# Patient Record
Sex: Male | Born: 1935 | Race: White | Hispanic: No | Marital: Married | Smoking: Former smoker
Health system: Southern US, Community
[De-identification: ages and names within clinical notes are randomized; demographics above are authoritative.]

## PROBLEM LIST (undated history)

## (undated) DIAGNOSIS — I10 Essential (primary) hypertension: Secondary | ICD-10-CM

## (undated) DIAGNOSIS — F32A Depression, unspecified: Secondary | ICD-10-CM

## (undated) DIAGNOSIS — G20 Parkinson's disease: Secondary | ICD-10-CM

## (undated) DIAGNOSIS — F5104 Psychophysiologic insomnia: Secondary | ICD-10-CM

## (undated) DIAGNOSIS — E785 Hyperlipidemia, unspecified: Secondary | ICD-10-CM

## (undated) DIAGNOSIS — E119 Type 2 diabetes mellitus without complications: Secondary | ICD-10-CM

## (undated) DIAGNOSIS — F329 Major depressive disorder, single episode, unspecified: Secondary | ICD-10-CM

## (undated) DIAGNOSIS — F419 Anxiety disorder, unspecified: Secondary | ICD-10-CM

## (undated) DIAGNOSIS — N189 Chronic kidney disease, unspecified: Secondary | ICD-10-CM

## (undated) HISTORY — DX: Anxiety disorder, unspecified: F41.9

## (undated) HISTORY — DX: Parkinson's disease: G20

## (undated) HISTORY — DX: Essential (primary) hypertension: I10

## (undated) HISTORY — DX: Major depressive disorder, single episode, unspecified: F32.9

## (undated) HISTORY — DX: Hyperlipidemia, unspecified: E78.5

## (undated) HISTORY — DX: Depression, unspecified: F32.A

## (undated) HISTORY — DX: Psychophysiologic insomnia: F51.04

## (undated) HISTORY — DX: Type 2 diabetes mellitus without complications: E11.9

---

## 2001-08-17 ENCOUNTER — Encounter: Admission: RE | Admit: 2001-08-17 | Discharge: 2001-11-15 | Payer: Self-pay | Admitting: Internal Medicine

## 2006-07-09 ENCOUNTER — Ambulatory Visit: Payer: Self-pay | Admitting: Internal Medicine

## 2009-09-29 HISTORY — PX: CYST EXCISION: SHX5701

## 2009-09-29 HISTORY — PX: CATARACT EXTRACTION: SUR2

## 2010-05-21 ENCOUNTER — Ambulatory Visit (HOSPITAL_COMMUNITY): Admission: RE | Admit: 2010-05-21 | Discharge: 2010-05-21 | Payer: Self-pay | Admitting: General Surgery

## 2010-12-13 LAB — CBC
Hemoglobin: 15.9 g/dL (ref 13.0–17.0)
MCH: 32.3 pg (ref 26.0–34.0)
MCHC: 35.4 g/dL (ref 30.0–36.0)
MCV: 91.3 fL (ref 78.0–100.0)
Platelets: 193 10*3/uL (ref 150–400)
RBC: 4.93 MIL/uL (ref 4.22–5.81)

## 2010-12-13 LAB — DIFFERENTIAL
Eosinophils Absolute: 0.4 10*3/uL (ref 0.0–0.7)
Eosinophils Relative: 5 % (ref 0–5)
Lymphs Abs: 1.3 10*3/uL (ref 0.7–4.0)
Monocytes Relative: 7 % (ref 3–12)

## 2010-12-13 LAB — BASIC METABOLIC PANEL
CO2: 29 mEq/L (ref 19–32)
Chloride: 100 mEq/L (ref 96–112)
Creatinine, Ser: 1.05 mg/dL (ref 0.4–1.5)
GFR calc Af Amer: >60 mL/min (ref >60)
Glucose, Bld: 331 mg/dL — ABNORMAL HIGH (ref 70–99)

## 2010-12-13 LAB — GLUCOSE, CAPILLARY

## 2011-02-14 NOTE — Assessment & Plan Note (Signed)
[facility]                           GASTROENTEROLOGY OFFICE NOTE   RONDELL, PARDON                     MRN:          [id number]  DATE:07/09/2006                            DOB:          02-11-1924    REFERRING PHYSICIAN:  Barry Dienes. Eloise Harman, M.D.   REASON FOR CONSULTATION:  Surveillance colonoscopy.   HISTORY:  This is a pleasant 77 year old white male with a history of  hypertension, hyperlipidemia, and diabetes mellitus.  He also has a history  of adenomatous colon polyps.  He initially underwent surveillance  colonoscopy in November 2002.  He was found to have small cecal adenoma as  well as a 15-mm sessile tubulovillous adenoma of the ascending colon.  This  was completely resected.  Other findings included diverticulosis and  hemorrhoids.  He was to follow up in one year.  He did receive a reminder  letter.  He was reminded by Dr. Eloise Harman recently to return for followup as  he had complained of constipation.  The constipation came on rather  abruptly.  He was treated with a stool softener.  His problem has resolved.  He is having no abdominal pain or bleeding.  His weight is stable.   PAST MEDICAL HISTORY:  As above.   PAST SURGICAL HISTORY:  None.   ALLERGIES:  NONE.   CURRENT MEDICATIONS:  1. Glucophage 1 gram daily.  2. Aspirin one half daily.  3. Diovan 160 mg daily.  4. Lipitor 10 mg every other day.  5. Multivitamin daily.   FAMILY HISTORY:  Negative for gastrointestinal malignancy.   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   REVIEW OF SYSTEMS:  Per diagnostic evaluation form.   PHYSICAL EXAMINATION:  GENERAL:  A well-appearing male in no acute distress.  VITAL SIGNS:  Blood pressure 110/60, heart rate is 80, weight is 200 pounds.  He is 6 feet in height.  HEENT:  Sclerae are anicteric.   Conjunctivae are pink.  Oral mucosa is  intact.  No adenopathy.  LUNGS:  Clear.  HEART:  Regular.  ABDOMEN:  Soft without tenderness, mass, or hernia.  Good bowel sounds  heard.   IMPRESSION:  This is an 77 year old gentleman with a history of adenomatous  colon polyps who presents today regarding surveillance colonoscopy.   I told the patient that the current standard for followup with the type of  pathology that he initially had would be 3 years.  It is now 5 years.  Aside  from transient constipation relieved with stools softeners, he is having no  symptoms.  We discussed in detail the risks/benefit profile of surveillance  colonoscopy in great detail.  At this point, he has elected to postpone any  decision regarding a followup exam, though will reserve the right to contact  this office in the future to schedule such and exam.  At this point, he is  an appointment candidate without contraindication.  At this point, he will  return to the  care of Dr. Eloise Harman.            ______________________________  Wilhemina Bonito. Eda Keys., MD      JNP/MedQ  DD:  07/09/2006  DT:  07/10/2006  Job #:  [id number]   cc:   Barry Dienes. Eloise Harman, M.D.

## 2012-07-28 ENCOUNTER — Other Ambulatory Visit: Payer: Self-pay | Admitting: Internal Medicine

## 2012-07-28 DIAGNOSIS — R55 Syncope and collapse: Secondary | ICD-10-CM

## 2012-08-02 ENCOUNTER — Other Ambulatory Visit: Payer: Self-pay

## 2012-08-03 ENCOUNTER — Ambulatory Visit
Admission: RE | Admit: 2012-08-03 | Discharge: 2012-08-03 | Disposition: A | Discharge status: Home / Self Care | Payer: Medicare Other | Source: Ambulatory Visit | Attending: Internal Medicine | Admitting: Internal Medicine

## 2012-08-03 DIAGNOSIS — R55 Syncope and collapse: Secondary | ICD-10-CM

## 2013-01-27 ENCOUNTER — Ambulatory Visit (INDEPENDENT_AMBULATORY_CARE_PROVIDER_SITE_OTHER): Payer: 59 | Admitting: Neurology

## 2013-01-27 ENCOUNTER — Encounter: Payer: Self-pay | Admitting: Neurology

## 2013-01-27 VITALS — BP 139/65 | HR 74 | Ht 70.0 in | Wt 190.0 lb

## 2013-01-27 DIAGNOSIS — G20 Parkinson's disease: Secondary | ICD-10-CM

## 2013-01-27 DIAGNOSIS — G20C Parkinsonism, unspecified: Secondary | ICD-10-CM

## 2013-01-27 DIAGNOSIS — E785 Hyperlipidemia, unspecified: Secondary | ICD-10-CM

## 2013-01-27 DIAGNOSIS — E119 Type 2 diabetes mellitus without complications: Secondary | ICD-10-CM

## 2013-01-27 DIAGNOSIS — I10 Essential (primary) hypertension: Secondary | ICD-10-CM

## 2013-01-27 MED ORDER — DICLOFENAC SODIUM 3 % TD GEL: 1.0000 | TRANSDERMAL | Status: DC | PRN

## 2013-01-27 NOTE — Progress Notes (Signed)
HPI: Mr. Burley is a 79 years old right-handed Caucasian male, accompanied by his wife, referred by his primary care physician Dr. Ivery Quale for evaluation of bilateral hand tremor, recent falling accident,   He had a past medical history of hypertension, hyperlipidemia, diabetes,  Since 2011, he was noticed to have bilateral hand tremor, mild changing in his posture, stooped forward, decreased arm swing, his mother and sister all suffered Alzheimer's disease, he denies significant gait difficulty, he denies REM sleep disorder, no loss sense of smell  Over past 3 weeks, he had two fall accident, the first one was he was preparing  coffee,  he fell foward, he has to hold onto things, the second one was a week later, he was bending down to pick up something on the floor, when he straight up again, he felt lightheaded, almost fell, he denied loss of consciousness, no heart palpitation, no chest pain, no warning signs  Over the past couple years, in the morning time, he felt woozy headed, strange, has to sit down drinking tea or coffee, he felt better afterwards.  He denies significant low back pain, no incontinence, mild bilateral feet paresthesia, CT head was normal.  Azilect was too expensive. Requip 0.25mg  bid was added by Dr. Jarold Motto, no significant improvement noticed. He has bilateral hands shaking, most notictable while shaving, right hand tremor, unsteadiness.  UPDATE May 1st 2014:  I have started him on Sinemet 25/100 3 times a day since January 2014, he denies significant side effect, noticed no significant improvement, he also complains of mild worsening memory loss, continued to have bilateral hands shaking, no significant gait difficulty, he is still able to mow his yard. He complains of occasional low back pain, right shoulder achy discomfort, he had a history of right shoulder injury in the past   Physical Exam  Neck: supple no carotid bruits Respiratory: clear to  auscultation bilaterally Cardiovascular: regular rate rhythm  Neurologic Exam  Mental Status: pleasant, awake, alert, cooperative to history, talking, and casual conversation. MMSE 28/30, he missed 2/3 recalls Cranial Nerves: CN II-XII pupils were equal round reactive to light.  Fundi were sharp bilaterally.  Extraocular movements were full.  Visual fields were full on confrontational test.  Facial sensation and strength were normal.  Hearing was intact to finger rubbing bilaterally.  Uvula tongue were midline.  Head turning and shoulder shrugging were normal and symmetric.  Tongue protrusion into the cheeks strength were normal.  Motor: bilateral hand resting tremor, moderate right more than left and nuchal rigidity, early fatiguability with rapid wrist closure and opening. there was no weakness. He has a right shoulder scar, no limited range of motion Sensory: length dependent decreasd light touch, pinprick to distal shin, preserved toe proprioception, and decreased vibratory sensation to knee. Coordination: Normal finger-to-nose, heel-to-shin.  There was no dysmetria noticed. Gait and Station: Narrow based and steady,  smooth turning, mild decreased arm swing.  Romberg sign: Negative Reflexes: Deep tendon reflexes: Biceps: 2/2, Brachioradialis: 2/2, Triceps: 2/2, Pateller: 2/2, Achilles: absent.  Plantar responses are flexor.   Assessment and plan  79 years old right-handed Caucasian male, with past medical history of hypertension, diabetes, hyperlipidemia, presenting with length dependent sensory changes, orthostatic blood pressure changes, also bilateral hands resting tremor, mild to moderate rigidity, parkinsonian features, mild memory loss, mini-mental status examination 28 out of 30  1.  Parkinsonism, idiopathic Parkinson's disease, vs other central nervous system disorder with parkinsonian features. 2.  He may taper off sinemet 25/100 to  see if it was truly helping him 3,  diclofenac gel  as needed.

## 2013-02-02 ENCOUNTER — Telehealth: Payer: Self-pay

## 2013-02-02 MED ORDER — DICLOFENAC SODIUM 1 % TD GEL: 4.0000 g | Freq: Three times a day (TID) | TRANSDERMAL | Status: DC | PRN

## 2013-02-02 NOTE — Telephone Encounter (Signed)
[facility] sent [place] a fax saying the patient cannot afford the Diclofenac Gel, as it will cost him $1100.  It is excluded from his insurance plan.  They would like to know if something else could be prescribed.  Please advise.  Thank you.

## 2013-08-01 ENCOUNTER — Ambulatory Visit: Payer: 59 | Admitting: Nurse Practitioner

## 2013-12-06 ENCOUNTER — Encounter: Payer: Self-pay | Admitting: Neurology

## 2013-12-14 ENCOUNTER — Ambulatory Visit (INDEPENDENT_AMBULATORY_CARE_PROVIDER_SITE_OTHER): Payer: Medicare Other | Admitting: Neurology

## 2013-12-14 ENCOUNTER — Encounter: Payer: Self-pay | Admitting: Neurology

## 2013-12-14 VITALS — BP 142/68 | HR 94 | Resp 20 | Ht 72.0 in | Wt 188.0 lb

## 2013-12-14 DIAGNOSIS — G252 Other specified forms of tremor: Principal | ICD-10-CM

## 2013-12-14 DIAGNOSIS — G250 Essential tremor: Secondary | ICD-10-CM

## 2013-12-14 DIAGNOSIS — R413 Other amnesia: Secondary | ICD-10-CM

## 2013-12-14 NOTE — Patient Instructions (Signed)
1. Restart Aricept 5 mg daily. 2. Hold Selegiline.  3. No Driving.  4. Follow up in 8 weeks.

## 2013-12-14 NOTE — Progress Notes (Signed)
Justin Vaughn was seen today in the movement disorders clinic for neurologic consultation at the request of Sheryn Bison, MD.  The consultation is for the evaluation of tremor.  He also has seen Dr. Terrace Arabia and has seen her for the last 2-3 years.  I only have one note and that is from 01/2013.  He states that he was initially told he didn't have PD but that he was later told that he had PD.  Pt has had tremor for over 25 years but thinks that initially tremor was related to whiskey, beer and caffeine.  He remembers years ago seeing Dr. Corinda Gubler for this.  He quit drinking alcohol and still had some degree of tremor, but his wife states that it was improved.  Over the last 2 years, tremor has been worse per the pts wife (pt states that "they come and go" but wife disagrees.    Specific Symptoms:  Tremor: yes (drinks excessive caffeine as well) Voice: goes "squeaky" per the pt and weaker voice per wife; hypophonic per wife Sleep: takes OVC sleep medication but cannot remember which one  Vivid Dreams:  Unknown per pt  Acting out dreams:  no per wife, but they don't even sleep on the same floor Wet Pillows: yes Postural symptoms:  no  Falls?  yes; 2 falls over the last year.  One was in the driveway/yard and one was a simple faint  Bradykinesia symptoms: slow movements and drooling while awake Loss of smell:  no Loss of taste:  no Urinary Incontinence:  no Difficulty Swallowing:  no Handwriting, micrographia: no Trouble with ADL's:  no  Trouble buttoning clothing: yes Depression:  yes Memory changes:  yes; trouble with short term memory; wife does finances and always has; remembers to take own medications and not using pill box (but wife states that his DM medication was recently changed and pt has not made the change yet; wife states that has trouble even with short term memory - cannot remember what they talked about or goes into room after they talked about getting something and forgot  about it.  Pt was on aricept and recently stopped taking it because of cost - wife noticed deterioration when pt stopped.  Pt drives and has no trouble with getting to [employer] and [employer].  He doesn't drive anywhere else.  Generally, wife does driving for the last year) Hallucinations:  no  visual distortions: no N/V:  no Lightheaded:  no  Syncope: no, one episode a year ago (states that he was very anxious about going sister in laws funeral and then passed out) Diplopia:  no Dyskinesia:  no  PREVIOUS MEDICATIONS: Sinemet (on for 2 months according to our call with pharmacy, no help per Dr. Terrace Arabia records)  ALLERGIES:  No Known Allergies  CURRENT MEDICATIONS:  Current Outpatient Prescriptions on File Prior to Visit  Medication Sig Dispense Refill  . ALPRAZolam (XANAX) 0.25 MG tablet Take 0.25 mg by mouth at bedtime as needed for sleep.      Marland Kitchen aspirin 81 MG tablet Take 81 mg by mouth daily.      . Fiber CAPS Take by mouth daily.      Marland Kitchen glimepiride (AMARYL) 2 MG tablet Take 2 mg by mouth daily with breakfast.      . lisinopril-hydrochlorothiazide (PRINZIDE,ZESTORETIC) 10-12.5 MG per tablet Take 1 tablet by mouth daily.      Marland Kitchen lovastatin (MEVACOR) 20 MG tablet Take 20 mg by mouth at bedtime.      Marland Kitchen  metFORMIN (GLUCOPHAGE-XR) 500 MG 24 hr tablet Take 500 mg by mouth 2 (two) times daily.       . Multiple Vitamins-Minerals (MULTIVITAMIN PO) Take by mouth daily.      . polyethylene glycol (MIRALAX / GLYCOLAX) packet Take 17 g by mouth daily.      . selegiline (ELDEPRYL) 5 MG tablet Take 5 mg by mouth 2 (two) times daily with a meal.       No current facility-administered medications on file prior to visit.    PAST MEDICAL HISTORY:   Past Medical History  Diagnosis Date  . Hyperlipidemia   . Diabetes   . High blood pressure   . Parkinsonism   . Depression   . Anxiety     PAST SURGICAL HISTORY:   Past Surgical History  Procedure Laterality Date  . Cyst excision  2011    chest  wall  . Cataract extraction Bilateral 2011    SOCIAL HISTORY:   History   Social History  . Marital Status: Married    Spouse Name: N/A    Number of Children: N/A  . Years of Education: N/A   Occupational History  . Not on file.   Social History Main Topics  . Smoking status: Former Smoker    Quit date: 09/29/1973  . Smokeless tobacco: Not on file  . Alcohol Use: No     Comment: former drinker  . Drug Use: No  . Sexual Activity: Not on file   Other Topics Concern  . Not on file   Social History Narrative  . No narrative on file    FAMILY HISTORY:   Family Status  Relation Status Death Age  . Mother Deceased     Dementia, pneumonia  . Father Deceased     Sepsis, heavy smoker  . Son Alive   . Son Alive   . Daughter Alive   . Sister Deceased     dementia    ROS:  A complete 10 system review of systems was obtained and was unremarkable apart from what is mentioned above.  PHYSICAL EXAMINATION:    VITALS:   Filed Vitals:   12/14/13 1016  BP: 142/68  Pulse: 94  Resp: 20  Height: 6' (1.829 m)  Weight: 188 lb (85.276 kg)    GEN:  The patient appears stated age and is in NAD. HEENT:  Normocephalic, atraumatic.  The mucous membranes are moist. The superficial temporal arteries are without ropiness or tenderness. CV:  RRR Lungs:  CTAB Neck/HEME:  There are no carotid bruits bilaterally.  Neurological examination:  Orientation: The patient scored 24/30 on MoCA.  Able to correctly state month/date/year.  Had trouble with clock drawing. Cranial nerves: There is good facial symmetry. No facial hypomimia.  Pupils are equal round and reactive to light bilaterally. Fundoscopic exam reveals clear margins bilaterally. Extraocular muscles are intact. The visual fields are full to confrontational testing. The speech is fluent and clear. Soft palate rises symmetrically and there is no tongue deviation. Hearing is intact to conversational tone. Sensation: Sensation is  intact to light and pinprick throughout (facial, trunk, extremities). Vibration is intact at the bilateral big toe. There is no extinction with double simultaneous stimulation. There is no sensory dermatomal level identified. Motor: Strength is 5/5 in the bilateral upper and lower extremities.   Shoulder shrug is equal and symmetric.  There is no pronator drift. Deep tendon reflexes: Deep tendon reflexes are 2/4 at the bilateral biceps, triceps, brachioradialis, patella and achilles.  Plantar responses are downgoing bilaterally.  Movement examination: Tone: There is ? Very mild increased tone in the RUE vs gegenhalten.  Tone is normal in the left and in the bilateral lower extremities. Abnormal movements: There is a bilateral upper extremity resting tremor, that does increase with ambulation and with distraction techniques.  Minimal tremor antigravity and with intention. Coordination:  There is no significant decremation with RAM's Gait and Station: The patient has no difficulty arising out of a deep-seated chair without the use of the hands. The patient's stride length is normal.    ASSESSMENT/PLAN:  1.  Tremor.  -I do suspect that this represents a progression of the long-standing essential tremor that he has had.  He was more at rest than it is with intention, however, which is somewhat unusual.  However, he has little if any increased tone, no bradykinesia and no postural instability.  The tremor is not really bothersome to him.  He did not find levodopa useful in the past.  We decided to leave him off of this and give me some more time to evaluate him and any progression.  -I am going to try and stop the selegiline.  -He drinks an excessive amount of caffeine.  I asked him to discontinue this.  I would like to see all his coffee be decaf. 2.  memory loss.  -This is concerning for a neurodegenerative process.  He recently stopped the Aricept.  I really think that he should restart it, and his  wife does as well.  His wife noticed a deterioration when the medication was discontinued.  -We talked extensively about safety.  Greater than 50% of the 60 minute visit was spent in counseling in this regard.  We talked about driving.  We talked about getting a driving evaluation.  The patient was very honest and stated that he would rather quit driving, which I think is a reasonable approach.  He drives minimally now anyway. 3.  before next visit, I will try to get [facility]  I will also try to get a copy of labwork from his primary care physician at [facility].

## 2013-12-21 ENCOUNTER — Telehealth: Payer: Self-pay | Admitting: Neurology

## 2013-12-21 NOTE — Telephone Encounter (Signed)
Pt wife called and has some questions about rx please call [phone]

## 2013-12-21 NOTE — Telephone Encounter (Signed)
Spoke with patient's wife and she is aware we do not prescribe. He is going to stay off medication for now and call with any problems.

## 2013-12-21 NOTE — Telephone Encounter (Signed)
Patient's wife asking if we would give patient a refill on his Xanax. He takes this once a day at bedtime, .25 mg. Please advise.

## 2013-12-21 NOTE — Telephone Encounter (Signed)
Left message on machine for patient to call back. To make patient aware Dr Tat does not prescribe Xanax. Awaiting call back.

## 2013-12-21 NOTE — Telephone Encounter (Signed)
I don't prescribe that medication but I also don't recommend it as it can worsen memory issues.

## 2014-01-23 ENCOUNTER — Telehealth: Payer: Self-pay | Admitting: Neurology

## 2014-01-23 MED ORDER — DONEPEZIL HCL 5 MG PO TABS: 5.0000 mg | ORAL_TABLET | Freq: Every day | ORAL | Status: DC

## 2014-01-23 NOTE — Telephone Encounter (Signed)
Pt wife called and needs to talk to someone about some medication please call [phone]

## 2014-01-23 NOTE — Telephone Encounter (Signed)
Called pharmacy and prescription does require prior authorization. Faxed to insurance and awaiting response.

## 2014-01-23 NOTE — Telephone Encounter (Signed)
Spoke with patient's wife. They received denial for coverage of Aricept. The original RX was written by Dr Jarold MottoPatterson. I advised I would send in a new RX to pharmacy because we could not appeal a medication we did not prescribe. RX sent to pharmacy. Wife gave me the number to appeal the insurance [phone]. Made her aware I would wait and see if we received a prior auth request and contact the insurance company. She expressed appreciation.

## 2014-02-07 ENCOUNTER — Telehealth: Payer: Self-pay | Admitting: Neurology

## 2014-02-07 NOTE — Telephone Encounter (Signed)
Spoke with [employer] on 02/01/2014 to check on status of prior authorization sent to them on 01/23/2014 for Aricept 5 mg tablets. [facility]19/2013 and denied, so when they received our prior authorization request it was automatically sent to the appeals department of [facility]. I spoke with 5 different departments and finally spoke with a manager, who states the claim looks like it was denied to to limited prescription coverage. She gave me fax number [phone] to fax additional records for patient to support the need for medication and these records were faxed on 02/01/2014. Awaiting a decision from insurance on medication.

## 2014-02-08 ENCOUNTER — Telehealth: Payer: Self-pay | Admitting: Neurology

## 2014-02-08 ENCOUNTER — Ambulatory Visit (INDEPENDENT_AMBULATORY_CARE_PROVIDER_SITE_OTHER): Payer: Medicare Other | Admitting: Neurology

## 2014-02-08 ENCOUNTER — Encounter: Payer: Self-pay | Admitting: Neurology

## 2014-02-08 VITALS — BP 122/68 | HR 64 | Resp 16 | Ht 72.0 in | Wt 190.0 lb

## 2014-02-08 DIAGNOSIS — G252 Other specified forms of tremor: Principal | ICD-10-CM

## 2014-02-08 DIAGNOSIS — R413 Other amnesia: Secondary | ICD-10-CM

## 2014-02-08 DIAGNOSIS — G250 Essential tremor: Secondary | ICD-10-CM

## 2014-02-08 NOTE — Telephone Encounter (Signed)
Need to call wife later to discuss some things about patient. 1) has patient stopped selegiline 2) patient does not have parkinson's disease 3) if they have to pay out of pocket for aricept- Dr Tat doesn't think it is beneficially enough to warrant it. 4) Since we do not have patient on any medications he does not need to follow up with us unless they just want to - if so f/u 6 months.  Will call patient later to discuss.

## 2014-02-08 NOTE — Progress Notes (Signed)
Justin Vaughn was seen today in the movement disorders clinic for neurologic consultation at the request of Verl Blalock, MD.  The consultation is for the evaluation of tremor.  He also has seen Dr. Krista Blue and has seen her for the last 2-3 years.  I only have one note and that is from 01/2013.  He states that he was initially told he didn't have PD but that he was later told that he had PD.  Pt has had tremor for over 25 years but thinks that initially tremor was related to whiskey, beer and caffeine.  He remembers years ago seeing Dr. Velora Heckler for this.  He quit drinking alcohol and still had some degree of tremor, but his wife states that it was improved.  Over the last 2 years, tremor has been worse per the pts wife (pt states that "they come and go" but wife disagrees.    02/08/14 update:  The patient returns today for followup.  His son brought him, but does not accompany him to the examination room and the patient did not want him in the examination room.  Last visit, I discontinued the patient's selegiline.  The patient believes he is off of the medication, but is unsure.  The patient has backed off on his caffeine use, but admits he has not discontinued it altogether.  He does state that tremor is better.  I was able to get notes back to 2009, when the patient first was seen at [facility].  The first impression was ET versus PD.  In November, 2013 the impression consisted of a myriad of things including "parkinsonism, idiopathic Parkinson's disease or other CNS disorder with parkinsonian features."  He was started on Azilect at that time.  Overall, the patient states that he is doing well today.  He did try to restart his Aricept, but it was denied by his insurance company as he has limited prescription coverage.  It turns out that he pays out-of-pocket for the medication and it is costing him $80.  He really does not want to pay for the medication.  He is frustrated by this, as he does not think it is  particularly helpful.  Last visit, his wife saw a big difference when he had discontinued the medication, however.  He admits to being stressed.  He states he bought and sold a car since last visit and the good thing is that tremor was so much better that he was able to sign the papers without any difficulty.  However, he states that the new car that he bought has had some problems and that has caused a great amount of stress.  He admits to some intermittent low back pain and Aleve does help but it makes him sleepy within 15 minutes of taking it and his wife gets upset if he takes a nap.  He states he does not take the Aleve often.  PREVIOUS MEDICATIONS: Sinemet (on for 2 months according to our call with pharmacy, no help per Dr. Krista Blue records)  ALLERGIES:  No Known Allergies  CURRENT MEDICATIONS:  Current Outpatient Prescriptions on File Prior to Visit  Medication Sig Dispense Refill  . aspirin 81 MG tablet Take 81 mg by mouth daily.      Marland Kitchen donepezil (ARICEPT) 5 MG tablet Take 1 tablet (5 mg total) by mouth at bedtime.  30 tablet  5  . Fiber CAPS Take by mouth daily.      Marland Kitchen glimepiride (AMARYL) 2 MG tablet  Take 2 mg by mouth daily with breakfast.      . lisinopril-hydrochlorothiazide (PRINZIDE,ZESTORETIC) 10-12.5 MG per tablet Take 1 tablet by mouth daily.      Marland Kitchen lovastatin (MEVACOR) 20 MG tablet Take 20 mg by mouth at bedtime.      . metFORMIN (GLUCOPHAGE-XR) 500 MG 24 hr tablet Take 500 mg by mouth 2 (two) times daily.       . Multiple Vitamins-Minerals (MULTIVITAMIN PO) Take by mouth daily.      . polyethylene glycol (MIRALAX / GLYCOLAX) packet Take 17 g by mouth daily.      . selegiline (ELDEPRYL) 5 MG tablet Take 5 mg by mouth 2 (two) times daily with a meal.       No current facility-administered medications on file prior to visit.    PAST MEDICAL HISTORY:   Past Medical History  Diagnosis Date  . Hyperlipidemia   . Diabetes   . High blood pressure   . Parkinsonism   . Depression    . Anxiety     PAST SURGICAL HISTORY:   Past Surgical History  Procedure Laterality Date  . Cyst excision  2011    chest wall  . Cataract extraction Bilateral 2011    SOCIAL HISTORY:   History   Social History  . Marital Status: Married    Spouse Name: N/A    Number of Children: N/A  . Years of Education: N/A   Occupational History  . Not on file.   Social History Main Topics  . Smoking status: Former Smoker    Quit date: 09/29/1973  . Smokeless tobacco: Not on file  . Alcohol Use: No     Comment: former drinker  . Drug Use: No  . Sexual Activity: Not on file   Other Topics Concern  . Not on file   Social History Narrative  . No narrative on file    FAMILY HISTORY:   Family Status  Relation Status Death Age  . Mother Deceased     Dementia, pneumonia  . Father Deceased     Sepsis, heavy smoker  . Son Alive   . Son Alive   . Daughter Alive   . Sister Deceased     dementia    ROS:  A complete 10 system review of systems was obtained and was unremarkable apart from what is mentioned above.  PHYSICAL EXAMINATION:    VITALS:   Filed Vitals:   02/08/14 1002  BP: 122/68  Pulse: 64  Resp: 16  Height: 6' (1.829 m)  Weight: 190 lb (86.183 kg)    GEN:  The patient appears stated age and is in NAD. HEENT:  Normocephalic, atraumatic.  The mucous membranes are moist. The superficial temporal arteries are without ropiness or tenderness. CV:  RRR Lungs:  CTAB Neck/HEME:  There are no carotid bruits bilaterally.  Neurological examination:  Orientation: Able to correctly state month/date/year.  Had trouble with medication reconciliation today.  Had trouble with history. Cranial nerves: There is good facial symmetry. No facial hypomimia.  Pupils are equal round and reactive to light bilaterally. Fundoscopic exam reveals clear margins bilaterally. Extraocular muscles are intact. The visual fields are full to confrontational testing. The speech is fluent and  clear. Soft palate rises symmetrically and there is no tongue deviation. Hearing is intact to conversational tone. Sensation: Sensation is intact to light and pinprick throughout (facial, trunk, extremities). Vibration is intact at the bilateral big toe. There is no extinction with double simultaneous stimulation.  There is no sensory dermatomal level identified. Motor: Strength is 5/5 in the bilateral upper and lower extremities.   Shoulder shrug is equal and symmetric.  There is no pronator drift. Deep tendon reflexes: Deep tendon reflexes are 2/4 at the bilateral biceps, triceps, brachioradialis, patella and achilles. Plantar responses are downgoing bilaterally.  Movement examination: Tone: There is no rigidity at all today in the upper or lower extremities Abnormal movements: There is a bilateral upper extremity resting tremor, that does increase with ambulation and with distraction techniques.  Minimal tremor antigravity and with intention. Coordination:  There is no significant decremation with RAM's Gait and Station: The patient has no difficulty arising out of a deep-seated chair without the use of the hands. The patient's stride length is normal.  There is tremor in the hands bilaterally with ambulation.  ASSESSMENT/PLAN:  1.  Tremor.  -I do suspect that this represents a progression of the long-standing essential tremor that he has had.  He was more at rest than it is with intention, however, which is somewhat unusual.  However, I saw no increased tone today, no bradykinesia and no postural instability.  The tremor is not really bothersome to him.  He did not find levodopa useful in the past.  In addition, now that I have reviewed his old records and see that it has been since 2009 that they have questioned a possible diagnosis of Parkinson's disease, I am able to clearly see that he does not have Parkinson's disease.  This would have shown itself very clearly by now in the form of  bradykinesia and rigidity, which she has none of.  He will continue off of the selegiline.  I would keep him off of any tremor medications, as most of those will affect cognition and I reiterated that today.  He agreed.  I would like to see all his coffee be decaf. 2.  memory loss.  -This is concerning for a neurodegenerative process.  We have appealed to his insurance company, for some reason they are not paying for the generic Aricept.  This is costing him $80 per month.  I am not sure that the benefits are with him paying out of pocket for it.  I told him that we will call his wife and discuss it with her in the next few days, as she is currently out of town caring for their daughter who just had surgery. 3.  low back pain.  -I offered him some physical therapy, as an alternative to medication, as he is very sensitive to medication.  He refused that.  He will let me know if he changes his mind. 4.  he will followup here on an as-needed basis.

## 2014-02-09 NOTE — Telephone Encounter (Signed)
Spoke with patient's wife and relayed information. He has stopped the Selegiline. They will call if they would like to follow up with us in the future.

## 2014-02-09 NOTE — Telephone Encounter (Signed)
Tried to call patient's wife with no answer. Will try again later.

## 2014-02-21 ENCOUNTER — Telehealth: Payer: Self-pay | Admitting: Neurology

## 2014-02-21 NOTE — Telephone Encounter (Signed)
[employer] called and stated the appeal for patient's Aricept was denied. They are sending a letter.

## 2014-03-28 ENCOUNTER — Telehealth: Payer: Self-pay | Admitting: Neurology

## 2014-03-28 NOTE — Telephone Encounter (Signed)
Spoke with Dr Silvano RuskPaterson's nurse after receiving referral on patient. Made them aware we have seen patient x2. Notes faxed to them at [phone] with confirmation received.

## 2014-04-24 ENCOUNTER — Ambulatory Visit: Payer: Medicare Other | Admitting: Neurology

## 2015-02-01 ENCOUNTER — Other Ambulatory Visit: Payer: Self-pay | Admitting: Neurology

## 2015-02-01 NOTE — Telephone Encounter (Signed)
RX Aricept denied. Patient had stopped medication due to insurance denial.

## 2016-02-27 ENCOUNTER — Ambulatory Visit (INDEPENDENT_AMBULATORY_CARE_PROVIDER_SITE_OTHER): Payer: Medicare Other | Admitting: Podiatry

## 2016-02-27 ENCOUNTER — Encounter: Payer: Self-pay | Admitting: Podiatry

## 2016-02-27 VITALS — BP 121/78 | HR 69 | Resp 12

## 2016-02-27 DIAGNOSIS — B351 Tinea unguium: Secondary | ICD-10-CM | POA: Diagnosis not present

## 2016-02-27 DIAGNOSIS — M79675 Pain in left toe(s): Secondary | ICD-10-CM | POA: Diagnosis not present

## 2016-02-27 DIAGNOSIS — M79674 Pain in right toe(s): Secondary | ICD-10-CM

## 2016-02-27 NOTE — Patient Instructions (Signed)
Diabetes and Foot Care Diabetes may cause you to have problems because of poor blood supply (circulation) to your feet and legs. This may cause the skin on your feet to become thinner, break easier, and heal more slowly. Your skin may become dry, and the skin may peel and crack. You may also have nerve damage in your legs and feet causing decreased feeling in them. You may not notice minor injuries to your feet that could lead to infections or more serious problems. Taking care of your feet is one of the most important things you can do for yourself.  HOME CARE INSTRUCTIONS  Wear shoes at all times, even in the house. Do not go barefoot. Bare feet are easily injured.  Check your feet daily for blisters, cuts, and redness. If you cannot see the bottom of your feet, use a mirror or ask someone for help.  Wash your feet with warm water (do not use hot water) and mild soap. Then pat your feet and the areas between your toes until they are completely dry. Do not soak your feet as this can dry your skin.  Apply a moisturizing lotion or petroleum jelly (that does not contain alcohol and is unscented) to the skin on your feet and to dry, brittle toenails. Do not apply lotion between your toes.  Trim your toenails straight across. Do not dig under them or around the cuticle. File the edges of your nails with an emery board or nail file.  Do not cut corns or calluses or try to remove them with medicine.  Wear clean socks or stockings every day. Make sure they are not too tight. Do not wear knee-high stockings since they may decrease blood flow to your legs.  Wear shoes that fit properly and have enough cushioning. To break in new shoes, wear them for just a few hours a day. This prevents you from injuring your feet. Always look in your shoes before you put them on to be sure there are no objects inside.  Do not cross your legs. This may decrease the blood flow to your feet.  If you find a minor scrape,  cut, or break in the skin on your feet, keep it and the skin around it clean and dry. These areas may be cleansed with mild soap and water. Do not cleanse the area with peroxide, alcohol, or iodine.  When you remove an adhesive bandage, be sure not to damage the skin around it.  If you have a wound, look at it several times a day to make sure it is healing.  Do not use heating pads or hot water bottles. They may burn your skin. If you have lost feeling in your feet or legs, you may not know it is happening until it is too late.  Make sure your health care provider performs a complete foot exam at least annually or more often if you have foot problems. Report any cuts, sores, or bruises to your health care provider immediately. SEEK MEDICAL CARE IF:   You have an injury that is not healing.  You have cuts or breaks in the skin.  You have an ingrown nail.  You notice redness on your legs or feet.  You feel burning or tingling in your legs or feet.  You have pain or cramps in your legs and feet.  Your legs or feet are numb.  Your feet always feel cold. SEEK IMMEDIATE MEDICAL CARE IF:   There is increasing redness,   swelling, or pain in or around a wound.  There is a red line that goes up your leg.  Pus is coming from a wound.  You develop a fever or as directed by your health care provider.  You notice a bad smell coming from an ulcer or wound.   This information is not intended to replace advice given to you by your health care provider. Make sure you discuss any questions you have with your health care provider.   Document Released: 09/12/2000 Document Revised: 05/18/2013 Document Reviewed: 02/22/2013 Elsevier Interactive Patient Education 2016 [employer] Inc[employer]  

## 2016-02-27 NOTE — Progress Notes (Signed)
   Subjective:    Patient ID: Justin NgWilliam H Vaughn, male    DOB: 1924-06-15, 82 y.o.   MRN: [id number]  HPI     This patient presents today with wife present in treatment room as complaining of thickened and elongated toenails which are on cough walking wearing shoes gradually increasing in discomfort in the last several months. Patient is attempted trim the toenails relief, however, was not able to trim the toenails because of the deformity within the nail plates.  Patient is diabetic and denies any history of skin ulceration, claudication or amputation   Review of Systems  Cardiovascular: Positive for leg swelling.  Musculoskeletal: Positive for gait problem.       Objective:   Physical Exam  Orientated 3  Vascular: Mild peripheral pitting edema bilaterally DP pulses 0/4 bilaterally PT pulses 2/4 bilaterally Capillary reflex immediate bilaterally  Neurological: Sensation to 10 g monofilament wire intact 5/5 bilaterally Vibratory sensation nonreactive bilaterally Ankle reflex equal and reactive bilaterally  Dermatological: No open skin lesions bilaterally Dry scaling skin bilaterally Atrophic fad pad MPJ bilaterally Plantar callus second MPJ left Toenails are elongated, brittle, deformed, discolored and tender to direct palpation 6-10  Musculoskeletal: No deformities noted bilaterally There is no restriction ankle, subtalar, midtarsal joints bilaterally       Assessment & Plan:   Assessment: Absent DP pulses bilaterally Protective sensation intact bilaterally Mild diabetic peripheral neuropathy Neglected symptomatic onychomycoses 6-10  Plan: Reviewed results of the examination with patient and wife today and recommended debridement of the toenails and return intervals of 3 months. Recommended surveillance of feet for any changes. Patient verbally consents  Toenails 6-10 were debrided mechanically and electrically without any bleeding  Reappoint 3 months

## 2016-05-06 ENCOUNTER — Ambulatory Visit (INDEPENDENT_AMBULATORY_CARE_PROVIDER_SITE_OTHER): Payer: Medicare Other | Admitting: Podiatry

## 2016-05-06 ENCOUNTER — Encounter: Payer: Self-pay | Admitting: Podiatry

## 2016-05-06 DIAGNOSIS — M79674 Pain in right toe(s): Secondary | ICD-10-CM | POA: Diagnosis not present

## 2016-05-06 DIAGNOSIS — M79675 Pain in left toe(s): Secondary | ICD-10-CM | POA: Diagnosis not present

## 2016-05-06 DIAGNOSIS — B351 Tinea unguium: Secondary | ICD-10-CM

## 2016-05-06 NOTE — Patient Instructions (Signed)
Diabetes and Foot Care Diabetes may cause you to have problems because of poor blood supply (circulation) to your feet and legs. This may cause the skin on your feet to become thinner, break easier, and heal more slowly. Your skin may become dry, and the skin may peel and crack. You may also have nerve damage in your legs and feet causing decreased feeling in them. You may not notice minor injuries to your feet that could lead to infections or more serious problems. Taking care of your feet is one of the most important things you can do for yourself.  HOME CARE INSTRUCTIONS  Wear shoes at all times, even in the house. Do not go barefoot. Bare feet are easily injured.  Check your feet daily for blisters, cuts, and redness. If you cannot see the bottom of your feet, use a mirror or ask someone for help.  Wash your feet with warm water (do not use hot water) and mild soap. Then pat your feet and the areas between your toes until they are completely dry. Do not soak your feet as this can dry your skin.  Apply a moisturizing lotion or petroleum jelly (that does not contain alcohol and is unscented) to the skin on your feet and to dry, brittle toenails. Do not apply lotion between your toes.  Trim your toenails straight across. Do not dig under them or around the cuticle. File the edges of your nails with an emery board or nail file.  Do not cut corns or calluses or try to remove them with medicine.  Wear clean socks or stockings every day. Make sure they are not too tight. Do not wear knee-high stockings since they may decrease blood flow to your legs.  Wear shoes that fit properly and have enough cushioning. To break in new shoes, wear them for just a few hours a day. This prevents you from injuring your feet. Always look in your shoes before you put them on to be sure there are no objects inside.  Do not cross your legs. This may decrease the blood flow to your feet.  If you find a minor scrape,  cut, or break in the skin on your feet, keep it and the skin around it clean and dry. These areas may be cleansed with mild soap and water. Do not cleanse the area with peroxide, alcohol, or iodine.  When you remove an adhesive bandage, be sure not to damage the skin around it.  If you have a wound, look at it several times a day to make sure it is healing.  Do not use heating pads or hot water bottles. They may burn your skin. If you have lost feeling in your feet or legs, you may not know it is happening until it is too late.  Make sure your health care provider performs a complete foot exam at least annually or more often if you have foot problems. Report any cuts, sores, or bruises to your health care provider immediately. SEEK MEDICAL CARE IF:   You have an injury that is not healing.  You have cuts or breaks in the skin.  You have an ingrown nail.  You notice redness on your legs or feet.  You feel burning or tingling in your legs or feet.  You have pain or cramps in your legs and feet.  Your legs or feet are numb.  Your feet always feel cold. SEEK IMMEDIATE MEDICAL CARE IF:   There is increasing redness,   swelling, or pain in or around a wound.  There is a red line that goes up your leg.  Pus is coming from a wound.  You develop a fever or as directed by your health care provider.  You notice a bad smell coming from an ulcer or wound.   This information is not intended to replace advice given to you by your health care provider. Make sure you discuss any questions you have with your health care provider.   Document Released: 09/12/2000 Document Revised: 05/18/2013 Document Reviewed: 02/22/2013 Elsevier Interactive Patient Education 2016 [employer] Inc[employer]  

## 2016-05-06 NOTE — Progress Notes (Signed)
Patient ID: Justin Vaughn, male   DOB: [birth year], 82 y.o.   MRN: [id number]  Subjective This patient presents today with wife present in treatment room as complaining of thickened and elongated toenails which are uncomfortable when walking wearing shoes gradually increasing in discomfort in the last several months. Patient is attempted trim the toenails relief, however, was not able to trim the toenails because of the deformity within the nail plates.  Patient is diabetic and denies any history of skin ulceration, claudication or amputation  Objective Orientated 3  Vascular: Mild peripheral pitting edema bilaterally DP pulses 0/4 bilaterally PT pulses 2/4 bilaterally Capillary reflex immediate bilaterally  Neurological: Sensation to 10 g monofilament wire intact 5/5 bilaterally Vibratory sensation nonreactive bilaterally Ankle reflex equal and reactive bilaterally  Dermatological: No open skin lesions bilaterally Dry scaling skin bilaterally Atrophic fad pad MPJ bilaterally Plantar callus second MPJ left Toenails are elongated, brittle, deformed, discolored and tender to direct palpation 6-10  Musculoskeletal: No deformities noted bilaterally There is no restriction ankle, subtalar, midtarsal joints bilaterally  Assessment: Absent DP pulses bilaterally Protective sensation intact bilaterally Mild diabetic peripheral neuropathy Neglected symptomatic onychomycoses 6-10  Plan: Reviewed results of the examination with patient and wife today and recommended debridement of the toenails and return intervals of 3 months. Recommended surveillance of feet for any changes. Patient verbally consents  Toenails 6-10 were debrided mechanically and electrically without any bleeding  Reappoint 3 months

## 2016-06-04 ENCOUNTER — Ambulatory Visit: Payer: Medicare Other | Admitting: Podiatry

## 2016-07-15 ENCOUNTER — Ambulatory Visit (INDEPENDENT_AMBULATORY_CARE_PROVIDER_SITE_OTHER): Payer: Medicare Other | Admitting: Podiatry

## 2016-07-15 ENCOUNTER — Encounter: Payer: Self-pay | Admitting: Podiatry

## 2016-07-15 DIAGNOSIS — M79674 Pain in right toe(s): Secondary | ICD-10-CM | POA: Diagnosis not present

## 2016-07-15 DIAGNOSIS — B351 Tinea unguium: Secondary | ICD-10-CM

## 2016-07-15 DIAGNOSIS — M79675 Pain in left toe(s): Secondary | ICD-10-CM | POA: Diagnosis not present

## 2016-07-15 NOTE — Patient Instructions (Signed)
Remove the Band-Aid on the left great toe 1-3 days and apply triple antibiotic ointment daily with a Band-Aid until a scab forms  Diabetes and Foot Care Diabetes may cause you to have problems because of poor blood supply (circulation) to your feet and legs. This may cause the skin on your feet to become thinner, break easier, and heal more slowly. Your skin may become dry, and the skin may peel and crack. You may also have nerve damage in your legs and feet causing decreased feeling in them. You may not notice minor injuries to your feet that could lead to infections or more serious problems. Taking care of your feet is one of the most important things you can do for yourself.  HOME CARE INSTRUCTIONS  Wear shoes at all times, even in the house. Do not go barefoot. Bare feet are easily injured.  Check your feet daily for blisters, cuts, and redness. If you cannot see the bottom of your feet, use a mirror or ask someone for help.  Wash your feet with warm water (do not use hot water) and mild soap. Then pat your feet and the areas between your toes until they are completely dry. Do not soak your feet as this can dry your skin.  Apply a moisturizing lotion or petroleum jelly (that does not contain alcohol and is unscented) to the skin on your feet and to dry, brittle toenails. Do not apply lotion between your toes.  Trim your toenails straight across. Do not dig under them or around the cuticle. File the edges of your nails with an emery board or nail file.  Do not cut corns or calluses or try to remove them with medicine.  Wear clean socks or stockings every day. Make sure they are not too tight. Do not wear knee-high stockings since they may decrease blood flow to your legs.  Wear shoes that fit properly and have enough cushioning. To break in new shoes, wear them for just a few hours a day. This prevents you from injuring your feet. Always look in your shoes before you put them on to be sure  there are no objects inside.  Do not cross your legs. This may decrease the blood flow to your feet.  If you find a minor scrape, cut, or break in the skin on your feet, keep it and the skin around it clean and dry. These areas may be cleansed with mild soap and water. Do not cleanse the area with peroxide, alcohol, or iodine.  When you remove an adhesive bandage, be sure not to damage the skin around it.  If you have a wound, look at it several times a day to make sure it is healing.  Do not use heating pads or hot water bottles. They may burn your skin. If you have lost feeling in your feet or legs, you may not know it is happening until it is too late.  Make sure your health care provider performs a complete foot exam at least annually or more often if you have foot problems. Report any cuts, sores, or bruises to your health care provider immediately. SEEK MEDICAL CARE IF:   You have an injury that is not healing.  You have cuts or breaks in the skin.  You have an ingrown nail.  You notice redness on your legs or feet.  You feel burning or tingling in your legs or feet.  You have pain or cramps in your legs and feet.  Your legs or feet are numb.  Your feet always feel cold. SEEK IMMEDIATE MEDICAL CARE IF:   There is increasing redness, swelling, or pain in or around a wound.  There is a red line that goes up your leg.  Pus is coming from a wound.  You develop a fever or as directed by your health care provider.  You notice a bad smell coming from an ulcer or wound.   This information is not intended to replace advice given to you by your health care provider. Make sure you discuss any questions you have with your health care provider.   Document Released: 09/12/2000 Document Revised: 05/18/2013 Document Reviewed: 02/22/2013 Elsevier Interactive Patient Education [employer].

## 2016-07-15 NOTE — Progress Notes (Signed)
Patient ID: Justin NgWilliam H Vaughn, male   DOB: [birth year], 82 y.o.   MRN: [id number]    Subjective This patient presents today with wife present in treatment room as complaining of thickened and elongated toenails which are uncomfortable when walking wearing shoes gradually increasing in discomfort in the last several months. Patient is attempted trim the toenails relief, however, was not able to trim the toenails because of the deformity within the nail plates.  Patient is diabetic and denies any history of skin ulceration, claudication or amputation  Objective Orientated 3  Vascular: Mild peripheral pitting edema bilaterally DP pulses 0/4 bilaterally PT pulses 2/4 bilaterally Capillary reflex immediate bilaterally  Neurological: Sensation to 10 g monofilament wire intact 5/5 bilaterally Vibratory sensation nonreactive bilaterally Ankle reflex equal and reactive bilaterally  Dermatological: No open skin lesions bilaterally Dry scaling skin bilaterally Atrophic fad pad MPJ bilaterally Plantar callus second MPJ left Toenails are elongated, brittle, deformed, discolored and tender to direct palpation 6-10  Musculoskeletal: No deformities noted bilaterally There is no restriction ankle, subtalar, midtarsal joints bilaterally  Assessment: Absent DP pulses bilaterally Protective sensation intact bilaterally Mild diabetic peripheral neuropathy  symptomatic onychomycoses 6-10  Plan:  Toenails 6-10 were debrided mechanically and electrically with slight bleeding distal left hallux toe treated with topical antibiotic ointment and Band-Aid. Patient instructed to remove Band-Aid 1-3 days and continue to apply topical antibiotic ointment and Band-Aid until a scab forms  Reappoint 3 months  Reappoint 3 months

## 2016-10-14 ENCOUNTER — Encounter: Payer: Self-pay | Admitting: Podiatry

## 2016-10-14 ENCOUNTER — Ambulatory Visit (INDEPENDENT_AMBULATORY_CARE_PROVIDER_SITE_OTHER): Payer: Medicare Other | Admitting: Podiatry

## 2016-10-14 DIAGNOSIS — M79674 Pain in right toe(s): Secondary | ICD-10-CM | POA: Diagnosis not present

## 2016-10-14 DIAGNOSIS — M79675 Pain in left toe(s): Secondary | ICD-10-CM | POA: Diagnosis not present

## 2016-10-14 DIAGNOSIS — B351 Tinea unguium: Secondary | ICD-10-CM | POA: Diagnosis not present

## 2016-10-14 NOTE — Progress Notes (Signed)
Patient ID: Justin NgWilliam H Vaughn, male   DOB: November 14, 1923, 83 y.o.   MRN: [id number]  Subjective This patient presents today with wife present in treatment room as complaining of thickened and elongated toenails which are uncomfortable when walking wearing shoes gradually increasing in discomfort in the last several months. Patient is attempted trim the toenails relief, however, was not able to trim the toenails because of the deformity within the nail plates.  Patient is diabetic and denies any history of skin ulceration, claudication or amputation  Objective Orientated 3  Vascular: Mild peripheral pitting edema bilaterally DP pulses 0/4 bilaterally PT pulses 2/4 bilaterally Capillary reflex immediate bilaterally  Neurological: Sensation to 10 g monofilament wire intact 5/5 bilaterally Vibratory sensation nonreactive bilaterally Ankle reflex equal and reactive bilaterally  Dermatological: No open skin lesions bilaterally Dry scaling skin bilaterally Atrophic fad pad MPJ bilaterally Plantar callus second MPJ left Toenails are elongated, brittle, deformed, discolored and tender to direct palpation 6-10  Musculoskeletal: No deformities noted bilaterally There is no restriction ankle, subtalar, midtarsal joints bilaterally  Assessment: Absent DP pulses bilaterally Protective sensation intact bilaterally Mild diabetic peripheral neuropathy  symptomatic onychomycoses 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

## 2016-10-14 NOTE — Patient Instructions (Signed)

## 2017-01-13 ENCOUNTER — Encounter: Payer: Self-pay | Admitting: Podiatry

## 2017-01-13 ENCOUNTER — Ambulatory Visit (INDEPENDENT_AMBULATORY_CARE_PROVIDER_SITE_OTHER): Payer: Medicare Other | Admitting: Podiatry

## 2017-01-13 DIAGNOSIS — R0989 Other specified symptoms and signs involving the circulatory and respiratory systems: Secondary | ICD-10-CM | POA: Diagnosis not present

## 2017-01-13 DIAGNOSIS — M79674 Pain in right toe(s): Secondary | ICD-10-CM

## 2017-01-13 DIAGNOSIS — M79675 Pain in left toe(s): Secondary | ICD-10-CM

## 2017-01-13 DIAGNOSIS — E0842 Diabetes mellitus due to underlying condition with diabetic polyneuropathy: Secondary | ICD-10-CM | POA: Diagnosis not present

## 2017-01-13 DIAGNOSIS — B351 Tinea unguium: Secondary | ICD-10-CM | POA: Diagnosis not present

## 2017-01-13 NOTE — Progress Notes (Signed)
Patient ID: Justin Vaughn, male   DOB: June 18, 1924, 83 y.o.   MRN: [id number]    Subjective This patient presents today with wife present in treatment room as complaining of thickened and elongated toenails which are uncomfortable when walking wearing shoes gradually increasing in discomfort in the last several months. Patient is attempted trim the toenails relief, however, was not able to trim the toenails because of the deformity within the nail plates.  Patient is diabetic and denies any history of skin ulceration, claudication or amputation  Objective Orientated 3  Vascular: Mild peripheral pitting edema bilaterally DP pulses 0/4 bilaterally PT pulses 2/4 bilaterally Capillary reflex immediate bilaterally  Neurological: Sensation to 10 g monofilament wire intact 5/5 bilaterally Vibratory sensation nonreactive bilaterally Ankle reflex equal and reactive bilaterally  Dermatological: No open skin lesions bilaterally Dry scaling skin bilaterally Atrophic fad pad MPJ bilaterally Plantar callus second MPJ left Toenails are elongated, brittle, deformed, discolored and tender to direct palpation 6-10  Musculoskeletal: No deformities noted bilaterally There is no restriction ankle, subtalar, midtarsal joints bilaterally  Assessment: Absent DP pulses bilaterally Protective sensation intact bilaterally diabetic peripheral neuropathy symptomatic onychomycoses 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

## 2017-01-13 NOTE — Patient Instructions (Signed)

## 2017-04-14 ENCOUNTER — Ambulatory Visit (INDEPENDENT_AMBULATORY_CARE_PROVIDER_SITE_OTHER): Payer: Medicare Other | Admitting: Podiatry

## 2017-04-14 ENCOUNTER — Encounter: Payer: Self-pay | Admitting: Podiatry

## 2017-04-14 VITALS — BP 171/89 | HR 76

## 2017-04-14 DIAGNOSIS — M79674 Pain in right toe(s): Secondary | ICD-10-CM

## 2017-04-14 DIAGNOSIS — B351 Tinea unguium: Secondary | ICD-10-CM | POA: Diagnosis not present

## 2017-04-14 DIAGNOSIS — M79675 Pain in left toe(s): Secondary | ICD-10-CM | POA: Diagnosis not present

## 2017-04-14 DIAGNOSIS — E1142 Type 2 diabetes mellitus with diabetic polyneuropathy: Secondary | ICD-10-CM | POA: Diagnosis not present

## 2017-04-14 NOTE — Patient Instructions (Signed)

## 2017-04-14 NOTE — Progress Notes (Signed)
Patient ID: Justin NgWilliam H Goble, male   DOB: 1924-09-19, 83 y.o.   MRN: [id number]   Subjective This patient presents today with wife present in treatment room as complaining of thickened and elongated toenails which are uncomfortable when walking wearing shoes gradually increasing in discomfort in the last several months. Patient is attempted trim the toenails relief, however, was not able to trim the toenails because of the deformity within the nail plates.  Patient is diabetic and denies any history of skin ulceration, claudication or amputation  Objective Orientated 3  Vascular: Mild peripheral pitting edema bilaterally DP pulses 0/4 bilaterally PT pulses 2/4 bilaterally Capillary reflex immediate bilaterally  Neurological: Sensation to 10 g monofilament wire intact 5/5 bilaterally Vibratory sensation nonreactive bilaterally Ankle reflex equal and reactive bilaterally  Dermatological: No open skin lesions bilaterally Dry scaling skin bilaterally Atrophic fad pad MPJ bilaterally Plantar callus second MPJ left Toenails are elongated, brittle, deformed, discolored and tender to direct palpation 6-10  Musculoskeletal: No deformities noted bilaterally There is no restriction ankle, subtalar, midtarsal joints bilaterally  Assessment: Absent DP pulses bilaterally Protective sensation intact bilaterally diabetic peripheral neuropathy symptomatic onychomycoses 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

## 2017-04-30 ENCOUNTER — Telehealth: Payer: Self-pay | Admitting: Neurology

## 2017-04-30 NOTE — Telephone Encounter (Signed)
Ok to switch to me. -VRP

## 2017-04-30 NOTE — Telephone Encounter (Signed)
Former patient of Dr. Terrace ArabiaYan that wants to switch to Dr. Marjory LiesPenumalli, pt was l/s 2014. Is it ok for patient to switch?

## 2017-06-15 ENCOUNTER — Encounter: Payer: Self-pay | Admitting: Diagnostic Neuroimaging

## 2017-06-15 ENCOUNTER — Ambulatory Visit (INDEPENDENT_AMBULATORY_CARE_PROVIDER_SITE_OTHER): Payer: Medicare Other | Admitting: Diagnostic Neuroimaging

## 2017-06-15 ENCOUNTER — Encounter: Payer: Self-pay | Admitting: *Deleted

## 2017-06-15 VITALS — BP 170/78 | HR 62 | Ht 72.0 in | Wt 168.0 lb

## 2017-06-15 DIAGNOSIS — F0281 Dementia in other diseases classified elsewhere with behavioral disturbance: Secondary | ICD-10-CM | POA: Diagnosis not present

## 2017-06-15 DIAGNOSIS — F02818 Dementia in other diseases classified elsewhere, unspecified severity, with other behavioral disturbance: Secondary | ICD-10-CM

## 2017-06-15 DIAGNOSIS — G20 Parkinson's disease: Secondary | ICD-10-CM

## 2017-06-15 MED ORDER — SERTRALINE HCL 25 MG PO TABS: 25.0000 mg | ORAL_TABLET | Freq: Every day | ORAL | 12 refills | Status: AC

## 2017-06-15 MED ORDER — CARBIDOPA-LEVODOPA 25-100 MG PO TABS: 1.0000 | ORAL_TABLET | Freq: Three times a day (TID) | ORAL | 6 refills | Status: AC

## 2017-06-15 NOTE — Patient Instructions (Signed)
Thank you for coming to see [place] at [facility]. I hope we have been able to provide you high quality care today.  You may receive a patient satisfaction survey over the next few weeks. We would appreciate your feedback and comments so that we may continue to improve ourselves and the health of our patients.   PARKINSON'S DISEASE - start carb/levo 25/100 (half tab three times a day x 2 weeks, then increase to 1 tab three times a day)  ANXIETY - start sertraline '25mg'$  daily  GAIT DIFFICULTY - use rollator walker and setup home health PT, nursing and social work    ~~~~~~~~~~~~~~~~~~~~~~~~~~~~~~~~~~~~~~~~~~~~~~~~~~~~~~~~~~~~~~~~~  DR. Mamoudou Mulvehill'S GUIDE TO HAPPY AND HEALTHY LIVING These are some of my general health and wellness recommendations. Some of them may apply to you better than others. Please use common sense as you try these suggestions and feel free to ask me any questions.   ACTIVITY/FITNESS Mental, social, emotional and physical stimulation are very important for brain and body health. Try learning a new activity (arts, music, language, sports, games).  Keep moving your body to the best of your abilities.     NUTRITION Eat more plants: colorful vegetables, nuts, seeds and berries.  Eat less sugar, salt, preservatives and processed foods.  Avoid toxins such as cigarettes and alcohol.  Drink water when you are thirsty. Warm water with a slice of lemon is an excellent morning drink to start the day.  Consider these websites for more information The Nutrition Source (https://www.henry-hernandez.biz/) Precision Nutrition (WindowBlog.ch)   RELAXATION Consider practicing mindfulness meditation or other relaxation techniques such as deep breathing, prayer, yoga, tai chi, massage. See website mindful.org or the apps Headspace or Calm to help get started.   SLEEP Try to get at least 7-8+ hours sleep per day.  Regular exercise and reduced caffeine will help you sleep better. Practice good sleep hygeine techniques. See website sleep.org for more information.   PLANNING Prepare estate planning, living will, healthcare POA documents. Sometimes this is best planned with the help of an attorney. Theconversationproject.org and agingwithdignity.org are excellent resources.

## 2017-06-15 NOTE — Progress Notes (Signed)
[facility]  PATIENT: Min Collymore Ickes DOB: 1924/06/23  REFERRING CLINICIAN: Roderic Ovens HISTORY FROM: patient and wife and chart review REASON FOR VISIT: new consult    HISTORICAL  CHIEF COMPLAINT:  Chief Complaint  Patient presents with  . NX  [place]  . Parkinson's, memory loss    took 3 days of sinemet, stopped due to restlessness.     HISTORY OF PRESENT ILLNESS:   83 year old male here for evaluation of anxiety, gait difficulty, tremor, memory loss. History of hypertension, hyperlipidemia, diabetes.  Around 2011 patient developed mild tremor in bilateral hands and gait difficulty. He also had intermittent lightheadedness. In 2014 he was evaluated by Dr. Krista Blue, and noted to have parkinsonian features, and treated empirically with Azilect and then changed to carbidopa/levodopa. In 2015 he was evaluated by Dr. Carles Collet, who noted some resting tremor but felt that this was progression of longer standing essential tremor. Concern about neurodegenerative process related to memory loss is also raised.  Since that time patient then followed up with only PCP. His symptoms have progressed. Now having difficulty getting out of chair and bed. Having more resting tremor. Having more anxiety. Having more short-term memory loss and confusion. Also with new symptoms of sialorrhea, constipation, pain. Patient having more difficulty with day-to-day activities. This is causing more stress for patient's wife who is primary caregiver. Therefore PCP referred patient back to neurology for further evaluation and management of symptoms.    REVIEW OF SYSTEMS: Full 14 system review of systems performed and negative with exception of: Decreased activity decreased appetite eye itching back pain joint pain agitation confusion decreased haustration depression anxiety.  ALLERGIES: No Known Allergies  HOME MEDICATIONS: Outpatient Medications Prior to Visit  Medication Sig Dispense Refill  .  ALPRAZolam (XANAX) 0.25 MG tablet Take 0.25 mg by mouth 2 (two) times daily as needed for anxiety.    Marland Kitchen aspirin 81 MG tablet Take 81 mg by mouth daily.    Marland Kitchen donepezil (ARICEPT) 10 MG tablet Take 10 mg by mouth at bedtime.     Marland Kitchen doxylamine, Sleep, (UNISOM) 25 MG tablet Take 25 mg by mouth at bedtime as needed.    . Fiber CAPS Take 3 capsules by mouth daily.     Marland Kitchen glimepiride (AMARYL) 2 MG tablet Take 2 mg by mouth daily with breakfast.    . lactulose (CHRONULAC) 10 GM/15ML solution Take by mouth 3 (three) times daily as needed for mild constipation.    Marland Kitchen losartan (COZAAR) 50 MG tablet Take 50 mg by mouth daily.     Marland Kitchen lovastatin (MEVACOR) 20 MG tablet Take 20 mg by mouth at bedtime.    . metFORMIN (GLUCOPHAGE-XR) 500 MG 24 hr tablet Take 500 mg by mouth 2 (two) times daily.     . Multiple Vitamins-Minerals (MULTIVITAMIN PO) Take by mouth daily.    . polyethylene glycol (MIRALAX / GLYCOLAX) packet Take 17 g by mouth daily as needed.     . carbidopa-levodopa (SINEMET IR) 10-100 MG tablet Take 1 tablet by mouth 2 (two) times daily.     Marland Kitchen ipratropium (ATROVENT) 0.03 % nasal spray Place 2 sprays into both nostrils 3 (three) times daily as needed for rhinitis.    Marland Kitchen lisinopril (PRINIVIL,ZESTRIL) 10 MG tablet Take 10 mg by mouth daily.     . selegiline (ELDEPRYL) 5 MG tablet Take 5 mg by mouth 2 (two) times daily with a meal.     No facility-administered medications prior to visit.  PAST MEDICAL HISTORY: Past Medical History:  Diagnosis Date  . Anxiety   . Chronic insomnia   . Depression   . Diabetes ([place])   . High blood pressure   . Hyperlipidemia   . Parkinsonism ([place])     PAST SURGICAL HISTORY: Past Surgical History:  Procedure Laterality Date  . CATARACT EXTRACTION Bilateral 2011  . CYST EXCISION  2011   chest wall    FAMILY HISTORY: History reviewed. No pertinent family history.  SOCIAL HISTORY:  Social History   Social History  . Marital status: Married    Spouse name:  N/A  . Number of children: N/A  . Years of education: N/A   Occupational History  . Not on file.   Social History Main Topics  . Smoking status: Former Smoker    Quit date: 09/29/1973  . Smokeless tobacco: Never Used  . Alcohol use No     Comment: former drinker  . Drug use: No  . Sexual activity: Not on file   Other Topics Concern  . Not on file   Social History Narrative   Lives at home with wife.  Has 3 children.  Lost one 2 yrs ago.  Retired from [employer].       PHYSICAL EXAM  GENERAL EXAM/CONSTITUTIONAL: Vitals:  Vitals:   06/15/17 1114  BP: (!) 170/78  Pulse: 62  Weight: 168 lb (76.2 kg)  Height: 6' (1.829 m)     Body mass index is 22.78 kg/m.  No exam data present  Patient is in no distress; well developed, nourished and groomed; neck is supple  CARDIOVASCULAR:  Examination of carotid arteries is normal; no carotid bruits  Regular rate and rhythm, no murmurs  Examination of peripheral vascular system by observation and palpation is normal  EYES:  Ophthalmoscopic exam of optic discs and posterior segments is normal; no papilledema or hemorrhages  MUSCULOSKELETAL:  Gait, strength, tone, movements noted in Neurologic exam below  NEUROLOGIC: MENTAL STATUS:  MMSE - Mini Mental State Exam 06/15/2017  Orientation to time 3  Orientation to Place 2  Registration 3  Attention/ Calculation 2  Recall 1  Language- name 2 objects 2  Language- repeat 1  Language- follow 3 step command 2  Language- read & follow direction 1  Write a sentence 1  Copy design 1  Total score 19    awake, alert, oriented to person; NOT PLACE OR TIME  DECR memory   DECR attention and concentration  DECR FLUENCY, comprehension intact, naming intact,   fund of knowledge appropriate  CRANIAL NERVE:   2nd - no papilledema on fundoscopic exam  2nd, 3rd, 4th, 6th - pupils equal and reactive to light, visual fields full to confrontation, extraocular muscles  intact, no nystagmus  5th - facial sensation symmetric  7th - facial strength symmetric  8th - hearing intact  9th - palate elevates symmetrically, uvula midline  11th - shoulder shrug symmetric  12th - tongue protrusion midline  MASKED FACIES  SIALORRHEA  HOARSE VOICE  MOTOR:   RESTING TREMOR IN BUE  COGWHEEL RIGIDITY AND BRADYKINESIA  normal bulk full strength in the BUE; BLE 4/5  SENSORY:   normal and symmetric to light touch, temperature, vibration  COORDINATION:   finger-nose-finger, fine finger movements --> ACTION TREMOR  REFLEXES:   deep tendon reflexes TRACE and symmetric  GAIT/STATION:   SHORT STEPS; DIFF TO STAND; UNSTEADY GAIT; SHUFFLING GAIT; TREMOR WITH WALKING    DIAGNOSTIC DATA (LABS, IMAGING, TESTING) -  I reviewed patient records, labs, notes, testing and imaging myself where available.  Lab Results  Component Value Date   WBC 8.5 05/20/2010   HGB 15.9 05/20/2010   HCT 45.0 05/20/2010   MCV 91.3 05/20/2010   PLT 193 05/20/2010      Component Value Date/Time   NA 137 05/20/2010 0800   K 4.2 05/20/2010 0800   CL 100 05/20/2010 0800   CO2 29 05/20/2010 0800   GLUCOSE 331 (H) 05/20/2010 0800   BUN 10 05/20/2010 0800   CREATININE 1.05 05/20/2010 0800   CALCIUM 9.5 05/20/2010 0800   GFRNONAA >60 05/20/2010 0800   GFRAA  05/20/2010 0800    >60        The eGFR has been calculated using the MDRD equation. This calculation has not been validated in all clinical situations. eGFR's persistently <60 mL/min signify possible Chronic Kidney Disease.   No results found for: CHOL, HDL, LDLCALC, LDLDIRECT, TRIG, CHOLHDL No results found for: HGBA1C No results found for: VITAMINB12 No results found for: TSH   08/03/12 CT head [I reviewed images myself and agree with interpretation. -VRP]  - Bilateral slight frontotemporal atrophy versus small right greater than left subdural hygromas.  No significant mass effect.  No no visible acute  stroke or bleed.     ASSESSMENT AND PLAN  83 y.o. year old male here with progressive tremor since 2011, now with bradykinesia, resting tremor, rigidity, stooped posture, shuffling gait, sialorrhea, memory loss, anxiety and constipation. Patient may have had a longer history of essential tremor in the past but now has signs and symptoms consistent with Parkinson's disease. Patient also has developed memory loss and dementia in the last few years, following the motor symptoms of Parkinson's. Therefore this likely represents dementia related to Parkinson's disease rather than alternate explanations such as dementia with Lewy bodies.    Dx:  1. Parkinson's disease ([place])   2. 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 Ambulatory referral to [facility]   Return in about 3 months (around 09/14/2017).    Penni Bombard, MD 2/77/8242, 35:36 AM Certified in Neurology, Neurophysiology and Neuroimaging  [facility] [address] [place], [place] [address] [phone]

## 2017-06-18 ENCOUNTER — Telehealth: Payer: Self-pay | Admitting: Diagnostic Neuroimaging

## 2017-06-18 NOTE — Telephone Encounter (Signed)
LMVM for wife to return call.  

## 2017-06-18 NOTE — Telephone Encounter (Signed)
I spoke to pts wife.  Since starting the Carbidopa-Levadopa (started with 1 tablet TID instead of 1/2 tab TID) and the sertraline (this has been since Tuesday.  Last night he was up all night, 5 x,  could not sleep.  (He is like a Engineer, agricultural, restless, agitated).  She does not know if zoloft or CL.  Has similar reaction one month ago when Dr, Jarold Motto put on Carbidopa-Levadopa.  I told to her to stop the medication for now.  Will check with Dr, Marjory Lies.  Gave a xanax now to try to calm down.  ? If zoloft to change to seroquel?  Please advise.

## 2017-06-18 NOTE — Telephone Encounter (Signed)
Pt's wife returned RN's call °

## 2017-06-18 NOTE — Telephone Encounter (Signed)
Stay off carb/levo; continue sertraline. -VRP

## 2017-06-18 NOTE — Telephone Encounter (Signed)
Pt's wife called in he has taken sertraline (ZOLOFT) 25 MG tablet for the past 3 days and he seems to be more agitated and is not sleeping during the night. She said he was up about 5 x last night. Her daughter said Seroquel helped her with her sleep and she is wanting to know if that would be a possibility. She is also wanting to know if he should continue taking ALPRAZolam (XANAX) 0.25 MG tablet. Please call to discuss.

## 2017-06-19 NOTE — Telephone Encounter (Signed)
I spoke to pt and relayed to stop the carb/levo. Continue the sertraline.  She will see how he does and will let us know next week.  He was better with the xanax after I called her back.  She is concerned with the sertraline (due to daughter being on this and having a bad reaction)  but will continue as recommended.

## 2017-06-19 NOTE — Telephone Encounter (Signed)
Pt wife is asking that RN Andrey Campanile calls her back

## 2017-06-19 NOTE — Telephone Encounter (Signed)
Spoke to pts wife.  She had called EMS on her husbands (stating he cant walk, is agitated).  They arrived as we spoke.

## 2017-06-22 NOTE — Telephone Encounter (Signed)
Pt's wife called in she has realized she started him 1 tab tid instead of 1/2 tab tid of carbidopa-levodopa (SINEMET IR) 25-100 MG tablet . She is wanting to know if she should restart him again at correct dose. Please call tomorrow after 2pm. Thank you  She also said if this does not work is there another medication he could take for parkinsons

## 2017-06-23 NOTE — Telephone Encounter (Signed)
LMVM for wife that I returned call.  Will try later.

## 2017-06-24 NOTE — Telephone Encounter (Signed)
I spoke to pts wife and pts caregiver, Maurine Cane, CNA.  Wife relayed that she restarted the sinemet at 1/2 tab po tid today and pt has been ok for far.  She asked about taking the zoloft and I told her to finish the titration of sinemet and then start the zoloft and if and change will call back.  [facility] will be out tomorrow (PT/OT/ST/ RN/ SW).  This will be good for her and pt (? LTC).

## 2017-06-25 ENCOUNTER — Telehealth: Payer: Self-pay | Admitting: Diagnostic Neuroimaging

## 2017-06-25 NOTE — Telephone Encounter (Signed)
The Physical Therapist Justin Vaughn called and requested to send a message back to the nurse regarding this patient. He wants the nurse to do he is doing the assessment at the home and the environment is not safe.  The patients wife is not able to take care of him, the patient has not had a bath in at least a week and fell 20 minutes prior to the therapist arriving, someone from next door had to come over and help him up. He would like to speak with our nurse regarding future options. Please call and advise.

## 2017-06-25 NOTE — Telephone Encounter (Signed)
Spoke to [facility].  He is there right now with pt and wife.  It is a unsafe enviroment for pt (sleeping upstairs, urine smell, pt had fallen prior to him coming.  Daughter contacted about unsafe enviroment for pt, will also call pcp, Dr. Eloise Harman about FL-2 for pt.  Son is a Occupational hygienist, daughter lives in [place].  He wanted [place] to be aware.  PT, RN as seen pt.  OT tomorrow.  Has SW order in place but he was not aware of this and will check on this.  Wife is in agreement , as she cannot take care of pt.

## 2017-06-26 ENCOUNTER — Telehealth: Payer: Self-pay | Admitting: Diagnostic Neuroimaging

## 2017-06-26 DIAGNOSIS — F0280 Dementia in other diseases classified elsewhere without behavioral disturbance: Secondary | ICD-10-CM

## 2017-06-26 DIAGNOSIS — G20 Parkinson's disease: Secondary | ICD-10-CM

## 2017-06-26 NOTE — Telephone Encounter (Signed)
I called Justin Vaughn for him and gave VO ok to start OT as per below.  Order for bedside commode and hsopital bed with half rails to [facility]

## 2017-06-26 NOTE — Addendum Note (Signed)
Addended by: Guy Begin on: 06/26/2017 01:12 PM   Modules accepted: Orders

## 2017-06-26 NOTE — Telephone Encounter (Signed)
Radovan (Acupuncturist)  with [facility] called office in reference to completing home health evaluation and he feels the patient needs to be placed in a memory care facility (family has agreed).  In the mean time of family finding a facility Radovan is requesting verbal orders for Occupational Therapy 1 time for  1w, 2 x 4w  Needing prescription for Bed side commode, hospital bed with half bed rails [facility] to [facility] @ [phone] along with demographic and insurance information. Please contact Radovan [phone] (leave a message for verbal orders is Radovan does not answer).

## 2017-06-30 NOTE — Addendum Note (Signed)
Addended by: Guy Begin on: 06/30/2017 11:32 AM   Modules accepted: Orders

## 2017-06-30 NOTE — Telephone Encounter (Signed)
Order for DME: bedside commode, hospital bed (semi-electric) with half bed rails faxed to [facility].

## 2017-07-01 ENCOUNTER — Emergency Department (HOSPITAL_COMMUNITY)
Admission: EM | Admit: 2017-07-01 | Discharge: 2017-07-01 | Disposition: A | Discharge status: Home / Self Care | Payer: Medicare Other | Attending: Emergency Medicine | Admitting: Emergency Medicine

## 2017-07-01 ENCOUNTER — Other Ambulatory Visit: Payer: Self-pay | Admitting: *Deleted

## 2017-07-01 ENCOUNTER — Encounter (HOSPITAL_COMMUNITY): Payer: Self-pay | Admitting: Emergency Medicine

## 2017-07-01 ENCOUNTER — Emergency Department (HOSPITAL_COMMUNITY): Payer: Medicare Other

## 2017-07-01 DIAGNOSIS — E119 Type 2 diabetes mellitus without complications: Secondary | ICD-10-CM | POA: Insufficient documentation

## 2017-07-01 DIAGNOSIS — R4182 Altered mental status, unspecified: Secondary | ICD-10-CM | POA: Diagnosis present

## 2017-07-01 DIAGNOSIS — Z7984 Long term (current) use of oral hypoglycemic drugs: Secondary | ICD-10-CM | POA: Diagnosis not present

## 2017-07-01 DIAGNOSIS — Z79899 Other long term (current) drug therapy: Secondary | ICD-10-CM | POA: Insufficient documentation

## 2017-07-01 DIAGNOSIS — I10 Essential (primary) hypertension: Secondary | ICD-10-CM | POA: Diagnosis not present

## 2017-07-01 DIAGNOSIS — G20 Parkinson's disease: Secondary | ICD-10-CM | POA: Insufficient documentation

## 2017-07-01 DIAGNOSIS — R45851 Suicidal ideations: Secondary | ICD-10-CM | POA: Diagnosis not present

## 2017-07-01 DIAGNOSIS — Z87891 Personal history of nicotine dependence: Secondary | ICD-10-CM | POA: Insufficient documentation

## 2017-07-01 DIAGNOSIS — F0390 Unspecified dementia without behavioral disturbance: Secondary | ICD-10-CM | POA: Diagnosis not present

## 2017-07-01 DIAGNOSIS — Z8659 Personal history of other mental and behavioral disorders: Secondary | ICD-10-CM | POA: Diagnosis not present

## 2017-07-01 DIAGNOSIS — Z7982 Long term (current) use of aspirin: Secondary | ICD-10-CM | POA: Insufficient documentation

## 2017-07-01 LAB — URINALYSIS, ROUTINE W REFLEX MICROSCOPIC
Bacteria, UA: NONE SEEN
Bilirubin Urine: NEGATIVE
Glucose, UA: >=500 mg/dL — AB
Hgb urine dipstick: NEGATIVE
KETONES UR: NEGATIVE mg/dL
LEUKOCYTES UA: NEGATIVE
Nitrite: NEGATIVE
PH: 5 (ref 5.0–8.0)
Protein, ur: 100 mg/dL — AB
SPECIFIC GRAVITY, URINE: 1.036 — AB (ref 1.005–1.030)

## 2017-07-01 LAB — COMPREHENSIVE METABOLIC PANEL
ALK PHOS: 137 U/L — AB (ref 38–126)
ALT: 17 U/L (ref 17–63)
ANION GAP: 7 (ref 5–15)
AST: 13 U/L — ABNORMAL LOW (ref 15–41)
Albumin: 3.8 g/dL (ref 3.5–5.0)
BILIRUBIN TOTAL: 0.3 mg/dL (ref 0.3–1.2)
BUN: 18 mg/dL (ref 6–20)
CALCIUM: 9.7 mg/dL (ref 8.9–10.3)
CO2: 30 mmol/L (ref 22–32)
Chloride: 100 mmol/L — ABNORMAL LOW (ref 101–111)
Creatinine, Ser: 1.1 mg/dL (ref 0.61–1.24)
GFR calc Af Amer: >60 mL/min (ref >60)
GFR, EST NON AFRICAN AMERICAN: 57 mL/min — AB (ref >60)
Glucose, Bld: 282 mg/dL — ABNORMAL HIGH (ref 65–99)
POTASSIUM: 4.2 mmol/L (ref 3.5–5.1)
Sodium: 137 mmol/L (ref 135–145)
TOTAL PROTEIN: 6.5 g/dL (ref 6.5–8.1)

## 2017-07-01 LAB — CBC
HEMATOCRIT: 42 % (ref 39.0–52.0)
Hemoglobin: 14.5 g/dL (ref 13.0–17.0)
MCH: 31.5 pg (ref 26.0–34.0)
MCHC: 34.5 g/dL (ref 30.0–36.0)
MCV: 91.1 fL (ref 78.0–100.0)
PLATELETS: 221 10*3/uL (ref 150–400)
RBC: 4.61 MIL/uL (ref 4.22–5.81)
RDW: 12.6 % (ref 11.5–15.5)
WBC: 10.5 10*3/uL (ref 4.0–10.5)

## 2017-07-01 NOTE — Discharge Instructions (Signed)
Patient's urine did not show signs of infection. His blood sugar was elevated (282) today, this is not something that would be contributing to confusion but is something that needs to be followed up and rechecked by his primary care doctor. Chest xray also shows right sided pulmonary nodular density and bibasilar atelectasis which needs to be followed up with repeat imaging in 3-4 weeks at the primary doctor's office. His blood pressure is also elevated and needs to be rechecked as well.   Patient denies suicidal and homicidal ideation in the Emergency Department and does not have a plan to harm himself. He is medically cleared to go home. Please schedule follow up appointment with patient's primary doctor for recheck of blood sugar, blood pressure and the chest xray findings today.   Please return to the Emergency department if patient's mental status declines from his baseline in any way, if he develops fever and worsening cough, if he has suicidal ideations again or if he has any new or worsening symptoms.

## 2017-07-01 NOTE — ED Triage Notes (Signed)
Per [facility] patient was brought from home to [facility] last night.  Reports spouse unable to care for pt due to dementia, unstable gait.  Reports that he became agitated last night at facility and said that he wanted to kill himself and that he wanted a gun.  [facility] called for SI.  Per facility-states that they are unable to handle patient if he continues to have SI.  States they wanted a psych evaluation and if he is clear he can return to facility.

## 2017-07-01 NOTE — ED Notes (Signed)
XR at bedside

## 2017-07-01 NOTE — ED Triage Notes (Signed)
Patient denies SI/HI at present.  Also states he does not remember saying he wanted to kill himself earlier.  Patient appears tired-daughter states patient was awake all night last night.

## 2017-07-01 NOTE — ED Notes (Signed)
Patient given orange juice

## 2017-07-01 NOTE — Telephone Encounter (Signed)
LMVM for daughter and also [facility] re: to order for sinemet 1/2 tablet po tid is ok of the 25-100.  LMVM with fax # for [facility] for order sheet.

## 2017-07-01 NOTE — Telephone Encounter (Signed)
Ok to stop selegiline. Reduce to 1 tab daily x 3 days, then stop. Continue on sertraline (zoloft). -VRP

## 2017-07-01 NOTE — ED Notes (Signed)
ED Provider at bedside. 

## 2017-07-01 NOTE — Telephone Encounter (Signed)
Karen/(Daughter)[phone] called said the pt has been moved to [facility] yesterday. She said they need current directions on sinemet as the directions [facility] has are 1 tab tid but pt still has 1 week taking 1/2 tab tid. She said they took him about 2pm yesterday, he was given 1 tab about 6pm. She said he is agitated and unhappy with the move, wants to leave badly. Said last night he was saying he wanted to kill himself and was put on suicide watch. Daughter is wanting him to stay on 1/2 tab until he can try to transition to a new place. Please call her asap

## 2017-07-01 NOTE — Telephone Encounter (Signed)
Error

## 2017-07-01 NOTE — Telephone Encounter (Signed)
I called to speak to [facility] about the medications pt is on and give the verbal orders.  I had to [facility] for them to return call. [phone].

## 2017-07-01 NOTE — Telephone Encounter (Signed)
Pt is now at the [facility] for behavirol psych eval now since making statement of killing self.  NP at the facility [facility] is stating that pt cannot be on selegiline and zoloft.  Please advise.

## 2017-07-01 NOTE — Telephone Encounter (Signed)
Ok to stay on carb/levo half tab three times a day for now. -VRP

## 2017-07-01 NOTE — ED Provider Notes (Signed)
[facility]-EMERGENCY DEPT Provider Note   CSN: [id number] Arrival date & time: 07/01/17  1412     History   Chief Complaint Chief Complaint  Patient presents with  . Altered Mental Status    HPI JAKUB DEBOLD is a 83 y.o. male.  HPI   Mr. Upadhyay is a 83 year old male with a history of Parkinson's, dementia, hyperlipidemia, hypertension, diabetes who presents the emergency department from his rehabilitation facility for suicidal ideations. Patient's daughter and mother are in the room who provide the majority of the history. Daughter states that patient was seen in the hospital 3 times last week for falling despite using his walker. He was taken to a nursing home yesterday for rehabilitation given his recent falls. She states that last night patient realized that he was in a nursing facility and stated that he wanted to get a revolver so that he could kill himself. She states that he was "angry and frustrated, he is a hard headed [place]." According to family members he has never had suicidal ideations in the past. Family members requested that facility give patient additional Xanax, and facility stated that they can only give medications exactly as they're prescribed. Family is frustrated because neurologist ordered 1 Sinemet twice a day, but verbally told family to give 0.5 pills twice a day. The facility also stated that they could not give patient his Unisom at night because interacted with Zoloft. Family is requesting prescription changes, and also states that he needs a psychiatric evaluation in order for him to go back to the facility.  When asked patient states that he does not want to hurt himself. He appears drowsy, lethargic. Family states that he did not sleep last night. He is able to correctly state his name, that he is in [place], [place]. Denies access to firearms.  Past Medical History:  Diagnosis Date  . Anxiety   . Chronic insomnia   . Depression   . Diabetes (HCC)   .  High blood pressure   . Hyperlipidemia   . Parkinsonism [facility])     Patient Active Problem List   Diagnosis Date Noted  . Other and unspecified hyperlipidemia   . Diabetes (HCC)   . High blood pressure     Past Surgical History:  Procedure Laterality Date  . CATARACT EXTRACTION Bilateral 2011  . CYST EXCISION  2011   chest wall       Home Medications    Prior to Admission medications   Medication Sig Start Date End Date Taking? Authorizing Provider  ALPRAZolam (XANAX) 0.25 MG tablet Take 0.25 mg by mouth 2 (two) times daily as needed for anxiety.   Yes [provider]  aspirin 81 MG tablet Take 81 mg by mouth daily.   Yes [provider]  carbidopa-levodopa (SINEMET IR) 25-100 MG tablet Take 1 tablet by mouth 3 (three) times daily before meals. Patient taking differently: Take 0.5 tablets by mouth 3 (three) times daily before meals. 14 Days 06/15/17  Yes Penumalli, Glenford Bayley, MD  diphenhydrAMINE (BENADRYL) 25 mg capsule Take 25 mg by mouth at bedtime.   Yes [provider]  donepezil (ARICEPT) 10 MG tablet Take 10 mg by mouth at bedtime.  03/27/17  Yes [provider]  Fiber CAPS Take 3 capsules by mouth daily.    Yes [provider]  glimepiride (AMARYL) 2 MG tablet Take 2 mg by mouth daily with breakfast.   Yes [provider]  lisinopril (PRINIVIL,ZESTRIL) 10 MG tablet Take  10 mg by mouth daily.   Yes [provider]  losartan (COZAAR) 50 MG tablet Take 50 mg by mouth daily.  06/13/17  Yes [provider]  lovastatin (MEVACOR) 20 MG tablet Take 20 mg by mouth at bedtime.   Yes [provider]  metFORMIN (GLUCOPHAGE-XR) 500 MG 24 hr tablet Take 500 mg by mouth 2 (two) times daily.    Yes [provider]  Multiple Vitamins-Minerals (MULTIVITAMIN PO) Take 1 tablet by mouth daily.    Yes [provider]  selegiline (ELDEPRYL) 5 MG capsule Take 5 mg by mouth 2 (two) times daily before a  meal.   Yes [provider]  tuberculin (APLISOL) 5 UNIT/0.1ML injection Inject 5 Units into the skin once.   Yes [provider]  doxylamine, Sleep, (UNISOM) 25 MG tablet Take 25 mg by mouth at bedtime as needed for sleep.     [provider]  lactulose (CHRONULAC) 10 GM/15ML solution Take by mouth 3 (three) times daily as needed for mild constipation.    [provider]  polyethylene glycol (MIRALAX / GLYCOLAX) packet Take 17 g by mouth daily as needed for moderate constipation.     [provider]  sertraline (ZOLOFT) 25 MG tablet Take 1 tablet (25 mg total) by mouth daily. 06/15/17   Penumalli, Glenford Bayley, MD    Family History History reviewed. No pertinent family history.  Social History Social History  Substance Use Topics  . Smoking status: Former Smoker    Quit date: 09/29/1973  . Smokeless tobacco: Never Used  . Alcohol use No     Comment: former drinker     Allergies   Ace inhibitors; Propranolol; and Prozac [fluoxetine hcl]   Review of Systems Review of Systems  Unable to perform ROS: Dementia     Physical Exam Updated Vital Signs BP (!) 172/86   Pulse 65   Temp 97.9 F (36.6 C) (Oral)   Resp 14   SpO2 95%   Physical Exam  Constitutional: He appears well-developed and well-nourished. No distress.  Calm, resting in bed.  HENT:  Head: Normocephalic and atraumatic.  Mouth/Throat: Oropharynx is clear and moist. No oropharyngeal exudate.  Eyes: Pupils are equal, round, and reactive to light. Conjunctivae are normal. Right eye exhibits no discharge. Left eye exhibits no discharge.  Neck: Normal range of motion. Neck supple.  Cardiovascular: Normal rate and regular rhythm.  Exam reveals no gallop and no friction rub.   No murmur heard. Pulmonary/Chest: Effort normal and breath sounds normal. No respiratory distress. He has no wheezes. He has no rales.  Patient with cough, clearing throat.   Abdominal: Soft. Bowel sounds  are normal. There is no tenderness.  Musculoskeletal: Normal range of motion.  Neurological: He is alert. Coordination normal.  Mental Status:  Oriented to person, place. Disoriented to time and situation. Able to follow two step commands. Is able to answer questions with short answers.  Cranial Nerves:  II:  Peripheral visual fields grossly normal, pupils equal, round, reactive to light III,IV, VI: ptosis not present, extra-ocular motions intact bilaterally  V,VII: smile symmetric, facial light touch sensation equal VIII: hearing grossly normal to voice  X: uvula elevates symmetrically  XI: bilateral shoulder shrug symmetric and strong XII: midline tongue extension without fassiculations Motor: 4/5 strength in upper and lower extremities bilaterally Sensory: Light touch normal in all extremities.  Cerebellar: normal finger-to-nose with bilateral upper extremities Gait: deferred due to patient's history of instability and falls CV:  distal pulses palpable throughout   Skin: Skin is warm and dry. Capillary refill takes less than 2 seconds. He is not diaphoretic.  Psychiatric:  Patient is calm. Able to respond appropriately to simple questions. Does not follow conversational topics. Denies SI/HI, responds "of course not" when asked if he wants to kill or harm himself.   Nursing note and vitals reviewed.    ED Treatments / Results  Labs (all labs ordered are listed, but only abnormal results are displayed) Labs Reviewed  COMPREHENSIVE METABOLIC PANEL - Abnormal; Notable for the following:       Result Value   Chloride 100 (*)    Glucose, Bld 282 (*)    AST 13 (*)    Alkaline Phosphatase 137 (*)    GFR calc non Af Amer 57 (*)    All other components within normal limits  URINALYSIS, ROUTINE W REFLEX MICROSCOPIC - Abnormal; Notable for the following:    APPearance HAZY (*)    Specific Gravity, Urine 1.036 (*)    Glucose, UA >=500 (*)    Protein, ur 100 (*)    Squamous Epithelial  / LPF 0-5 (*)    All other components within normal limits  CBC    EKG  EKG Interpretation None       Radiology Dg Chest Portable 1 View  Result Date: 07/01/2017 CLINICAL DATA:  Cough for several weeks EXAM: PORTABLE CHEST 1 VIEW COMPARISON:  None. FINDINGS: There is an oval subcentimeter nodular density in the lateral right mid lung zone. Healing posterolateral left rib fractures are noted. Low volumes with bibasilar atelectasis versus airspace disease. There is elevation of the left hemidiaphragm. Heart is mildly prominent. Normal vascularity. No pneumothorax. IMPRESSION: Bibasilar atelectasis versus airspace disease. Followup PA and lateral chest X-ray is recommended in 3-4 weeks following trial of antibiotic therapy to ensure resolution and exclude underlying malignancy. Nodular density in the right mid lung zone. Attention on follow-up dome Healing left rib fractures. Electronically Signed   By: Jolaine Click M.D.   On: 07/01/2017 16:26    Procedures Procedures (including critical care time)  Medications Ordered in ED Medications - No data to display   Initial Impression / Assessment and Plan / ED Course  I have reviewed the triage vital signs and the nursing notes.  Pertinent labs & imaging results that were available during my care of the patient were reviewed by me and considered in my medical decision making (see chart for details).  Clinical Course as of Jul 02 2147  Wed Jul 01, 2017  1706 Upon recheck, patient is alert. Family members at bedside state that his mental status is baseline. Patient denies SI/HI.   [ES]  18 Family members state that they talked to patient's neurologist and sorted out the medication dispute at the nursing home.   [ES]  [phone] Patient's daughter is asking if she can transport his father back to the nursing facility given that he denies SI/HI.   [ES]    Clinical Course User Index [ES] Kellie Shropshire, PA-C   Labs and imaging reviewed.  Patient's blood glucose is 282, he is a known diabetic, do not believe that this is contributing to his complaint today. Family has been informed of this lab result and to follow up with primary doctor regarding diabetic management. UA does not show evidence of infection, CBC WNL. Chest xray reveals broken ribs, likely related to his falls last week. Also reveals bibasilar atelectasis. Given lungs CTA and patient is  afebrile and mentation baseline do not think this is pneumonia and will not treat with antibiotics. Nodular density found in the right lung field, have informed patient's family about this and they agree to follow up with patient's primary doctor.   Patient consistently denies suicidal ideation, or plan to harm himself. He denies homicidal ideation as well. According to patient and family he does not have access to a firearm. Have given family option to consult TTS and both patient and family deny this request.   Patient's blood pressure is elevated today in the emergency department. I counseled patient and family members at bedside to get this rechecked at his primary doctor's office. Discussed this patient with Dr. Rubin Payor who agrees with plan to discharge to rehab facility.   Final Clinical Impressions(s) / ED Diagnoses   Final diagnoses:  History of suicidal ideation    New Prescriptions Discharge Medication List as of 07/01/2017  6:39 PM       Kellie Shropshire, PA-C 07/01/17 2148    Benjiman Core, MD 07/02/17 [phone]

## 2017-07-01 NOTE — ED Notes (Signed)
Bed: WA21 Expected date:  Expected time:  Means of arrival:  Comments: EMS/elderly/agitation

## 2017-07-02 NOTE — Telephone Encounter (Signed)
I called and had to [facility] at main desk, as Margo Aye 6, RN did not pick up when called.  I asked to call back about his pts medications.

## 2017-07-02 NOTE — Telephone Encounter (Signed)
I LMVM for receptionist to call me back or relay message to [place] [facility] to call us back.

## 2017-07-03 ENCOUNTER — Encounter: Payer: Self-pay | Admitting: *Deleted

## 2017-07-03 NOTE — Telephone Encounter (Signed)
I spoke to nurse at [facility]. She relayed that pt was taking sinemet 25/100 one tablet po TID. He had one fall yesterday.  He is taking zoloft  po daily and segeline  po daily for 3 days then stop.  She said needed to fax order for the change in sinemet to 1/2 tablet po tid.  Printed and placed on Dr. Richrd Humbles desk to sign then to fax.  Pt has been on the one tablet since at [facility] for last 3 days approx.

## 2017-07-20 ENCOUNTER — Ambulatory Visit: Payer: Medicare Other | Admitting: Podiatry

## 2017-09-08 ENCOUNTER — Ambulatory Visit: Payer: Medicare Other | Admitting: Podiatry

## 2017-09-14 ENCOUNTER — Ambulatory Visit: Payer: Medicare Other | Admitting: Diagnostic Neuroimaging

## 2017-11-21 ENCOUNTER — Inpatient Hospital Stay (HOSPITAL_COMMUNITY)
Admission: EM | Admit: 2017-11-21 | Discharge: 2017-11-27 | DRG: 189 | Disposition: E | Payer: Medicare Other | Attending: Internal Medicine | Admitting: Internal Medicine

## 2017-11-21 ENCOUNTER — Emergency Department (HOSPITAL_COMMUNITY): Payer: Medicare Other

## 2017-11-21 ENCOUNTER — Encounter (HOSPITAL_COMMUNITY): Payer: Self-pay

## 2017-11-21 DIAGNOSIS — J9601 Acute respiratory failure with hypoxia: Principal | ICD-10-CM | POA: Diagnosis present

## 2017-11-21 DIAGNOSIS — I959 Hypotension, unspecified: Secondary | ICD-10-CM | POA: Diagnosis present

## 2017-11-21 DIAGNOSIS — E860 Dehydration: Secondary | ICD-10-CM | POA: Diagnosis present

## 2017-11-21 DIAGNOSIS — Z66 Do not resuscitate: Secondary | ICD-10-CM

## 2017-11-21 DIAGNOSIS — Z515 Encounter for palliative care: Secondary | ICD-10-CM | POA: Diagnosis present

## 2017-11-21 DIAGNOSIS — A419 Sepsis, unspecified organism: Secondary | ICD-10-CM

## 2017-11-21 DIAGNOSIS — Z87891 Personal history of nicotine dependence: Secondary | ICD-10-CM

## 2017-11-21 DIAGNOSIS — E785 Hyperlipidemia, unspecified: Secondary | ICD-10-CM

## 2017-11-21 DIAGNOSIS — E1122 Type 2 diabetes mellitus with diabetic chronic kidney disease: Secondary | ICD-10-CM | POA: Diagnosis present

## 2017-11-21 DIAGNOSIS — I10 Essential (primary) hypertension: Secondary | ICD-10-CM | POA: Diagnosis present

## 2017-11-21 DIAGNOSIS — J189 Pneumonia, unspecified organism: Secondary | ICD-10-CM

## 2017-11-21 DIAGNOSIS — Z9841 Cataract extraction status, right eye: Secondary | ICD-10-CM

## 2017-11-21 DIAGNOSIS — F5104 Psychophysiologic insomnia: Secondary | ICD-10-CM | POA: Diagnosis present

## 2017-11-21 DIAGNOSIS — I452 Bifascicular block: Secondary | ICD-10-CM | POA: Diagnosis present

## 2017-11-21 DIAGNOSIS — N189 Chronic kidney disease, unspecified: Secondary | ICD-10-CM

## 2017-11-21 DIAGNOSIS — Z7984 Long term (current) use of oral hypoglycemic drugs: Secondary | ICD-10-CM

## 2017-11-21 DIAGNOSIS — J9600 Acute respiratory failure, unspecified whether with hypoxia or hypercapnia: Secondary | ICD-10-CM

## 2017-11-21 DIAGNOSIS — J9602 Acute respiratory failure with hypercapnia: Secondary | ICD-10-CM

## 2017-11-21 DIAGNOSIS — F32A Depression, unspecified: Secondary | ICD-10-CM

## 2017-11-21 DIAGNOSIS — F329 Major depressive disorder, single episode, unspecified: Secondary | ICD-10-CM | POA: Diagnosis present

## 2017-11-21 DIAGNOSIS — R6520 Severe sepsis without septic shock: Secondary | ICD-10-CM | POA: Diagnosis present

## 2017-11-21 DIAGNOSIS — I129 Hypertensive chronic kidney disease with stage 1 through stage 4 chronic kidney disease, or unspecified chronic kidney disease: Secondary | ICD-10-CM | POA: Diagnosis present

## 2017-11-21 DIAGNOSIS — Z888 Allergy status to other drugs, medicaments and biological substances status: Secondary | ICD-10-CM

## 2017-11-21 DIAGNOSIS — Z7982 Long term (current) use of aspirin: Secondary | ICD-10-CM

## 2017-11-21 DIAGNOSIS — G20 Parkinson's disease: Secondary | ICD-10-CM | POA: Diagnosis present

## 2017-11-21 DIAGNOSIS — E870 Hyperosmolality and hypernatremia: Secondary | ICD-10-CM | POA: Diagnosis present

## 2017-11-21 DIAGNOSIS — N179 Acute kidney failure, unspecified: Secondary | ICD-10-CM

## 2017-11-21 DIAGNOSIS — Z9842 Cataract extraction status, left eye: Secondary | ICD-10-CM | POA: Diagnosis not present

## 2017-11-21 DIAGNOSIS — F419 Anxiety disorder, unspecified: Secondary | ICD-10-CM | POA: Diagnosis present

## 2017-11-21 DIAGNOSIS — E119 Type 2 diabetes mellitus without complications: Secondary | ICD-10-CM

## 2017-11-21 DIAGNOSIS — G20C Parkinsonism, unspecified: Secondary | ICD-10-CM

## 2017-11-21 DIAGNOSIS — Z79899 Other long term (current) drug therapy: Secondary | ICD-10-CM

## 2017-11-21 DIAGNOSIS — I491 Atrial premature depolarization: Secondary | ICD-10-CM | POA: Diagnosis present

## 2017-11-21 HISTORY — DX: Chronic kidney disease, unspecified: N18.9

## 2017-11-21 LAB — I-STAT ARTERIAL BLOOD GAS, ED
Acid-Base Excess: 1 mmol/L (ref 0.0–2.0)
Bicarbonate: 26.4 mmol/L (ref 20.0–28.0)
O2 Saturation: 99 %
TCO2: 28 mmol/L (ref 22–32)
pCO2 arterial: 42.6 mmHg (ref 32.0–48.0)
pH, Arterial: 7.4 (ref 7.350–7.450)
pO2, Arterial: 124 mmHg — ABNORMAL HIGH (ref 83.0–108.0)

## 2017-11-21 LAB — COMPREHENSIVE METABOLIC PANEL
ALBUMIN: 2.7 g/dL — AB (ref 3.5–5.0)
ALT: <5 U/L — ABNORMAL LOW (ref 17–63)
AST: 18 U/L (ref 15–41)
Alkaline Phosphatase: 140 U/L — ABNORMAL HIGH (ref 38–126)
Anion gap: 16 — ABNORMAL HIGH (ref 5–15)
BUN: 35 mg/dL — AB (ref 6–20)
CHLORIDE: 112 mmol/L — AB (ref 101–111)
CO2: 24 mmol/L (ref 22–32)
Calcium: 9.5 mg/dL (ref 8.9–10.3)
Creatinine, Ser: 1.74 mg/dL — ABNORMAL HIGH (ref 0.61–1.24)
GFR calc Af Amer: 38 mL/min — ABNORMAL LOW (ref >60)
GFR, EST NON AFRICAN AMERICAN: 33 mL/min — AB (ref >60)
GLUCOSE: 440 mg/dL — AB (ref 65–99)
POTASSIUM: 3.5 mmol/L (ref 3.5–5.1)
SODIUM: 152 mmol/L — AB (ref 135–145)
Total Bilirubin: 0.7 mg/dL (ref 0.3–1.2)
Total Protein: 6.7 g/dL (ref 6.5–8.1)

## 2017-11-21 LAB — CBC WITH DIFFERENTIAL/PLATELET
Basophils Absolute: 0 10*3/uL (ref 0.0–0.1)
Basophils Relative: 0 %
EOS PCT: 0 %
Eosinophils Absolute: 0 10*3/uL (ref 0.0–0.7)
HEMATOCRIT: 44.8 % (ref 39.0–52.0)
Hemoglobin: 14.3 g/dL (ref 13.0–17.0)
Lymphocytes Relative: 6 %
Lymphs Abs: 1.2 10*3/uL (ref 0.7–4.0)
MCH: 29.6 pg (ref 26.0–34.0)
MCHC: 31.9 g/dL (ref 30.0–36.0)
MCV: 92.8 fL (ref 78.0–100.0)
MONOS PCT: 6 %
Monocytes Absolute: 1.2 10*3/uL — ABNORMAL HIGH (ref 0.1–1.0)
Neutro Abs: 17.8 10*3/uL — ABNORMAL HIGH (ref 1.7–7.7)
Neutrophils Relative %: 88 %
Platelets: 303 10*3/uL (ref 150–400)
RBC: 4.83 MIL/uL (ref 4.22–5.81)
RDW: 13.7 % (ref 11.5–15.5)
WBC: 20.2 10*3/uL — ABNORMAL HIGH (ref 4.0–10.5)

## 2017-11-21 LAB — I-STAT CG4 LACTIC ACID, ED: Lactic Acid, Venous: 3.64 mmol/L (ref 0.5–1.9)

## 2017-11-21 LAB — I-STAT TROPONIN, ED: Troponin i, poc: 0.01 ng/mL (ref 0.00–0.08)

## 2017-11-21 MED ORDER — VANCOMYCIN HCL IN DEXTROSE 1-5 GM/200ML-% IV SOLN: 1000.0000 mg | Freq: Once | INTRAVENOUS | Status: DC

## 2017-11-21 MED ORDER — HALOPERIDOL LACTATE 2 MG/ML PO CONC
0.5000 mg | ORAL | Status: DC | PRN
  Filled 2017-11-21: qty 0.3

## 2017-11-21 MED ORDER — LORAZEPAM 1 MG PO TABS: 1.0000 mg | ORAL_TABLET | ORAL | Status: DC | PRN

## 2017-11-21 MED ORDER — PIPERACILLIN-TAZOBACTAM 3.375 G IVPB: 3.3750 g | Freq: Three times a day (TID) | INTRAVENOUS | Status: DC

## 2017-11-21 MED ORDER — GLYCOPYRROLATE 0.2 MG/ML IJ SOLN: 0.2000 mg | INTRAMUSCULAR | Status: DC | PRN

## 2017-11-21 MED ORDER — VANCOMYCIN HCL 10 G IV SOLR
1500.0000 mg | Freq: Once | INTRAVENOUS | Status: AC
  Administered 2017-11-21: 1500 mg via INTRAVENOUS
  Filled 2017-11-21: qty 1500

## 2017-11-21 MED ORDER — LORAZEPAM 2 MG/ML IJ SOLN
1.0000 mg | INTRAMUSCULAR | Status: DC | PRN
  Administered 2017-11-21: 1 mg via INTRAVENOUS
  Filled 2017-11-21: qty 1

## 2017-11-21 MED ORDER — ATROPINE SULFATE 1 % OP SOLN
4.0000 [drp] | OPHTHALMIC | Status: DC | PRN
  Administered 2017-11-21: 4 [drp] via SUBLINGUAL
  Filled 2017-11-21: qty 2

## 2017-11-21 MED ORDER — GLYCOPYRROLATE 0.2 MG/ML IJ SOLN
0.2000 mg | INTRAMUSCULAR | Status: DC | PRN
  Administered 2017-11-21 (×2): 0.2 mg via INTRAVENOUS
  Filled 2017-11-21 (×2): qty 1

## 2017-11-21 MED ORDER — ACETAMINOPHEN 650 MG RE SUPP: 650.0000 mg | Freq: Four times a day (QID) | RECTAL | Status: DC | PRN

## 2017-11-21 MED ORDER — GLYCOPYRROLATE 1 MG PO TABS
1.0000 mg | ORAL_TABLET | ORAL | Status: DC | PRN
  Filled 2017-11-21: qty 1

## 2017-11-21 MED ORDER — ACETAMINOPHEN 325 MG PO TABS: 650.0000 mg | ORAL_TABLET | Freq: Four times a day (QID) | ORAL | Status: DC | PRN

## 2017-11-21 MED ORDER — LORAZEPAM 2 MG/ML PO CONC: 1.0000 mg | ORAL | Status: DC | PRN

## 2017-11-21 MED ORDER — SODIUM CHLORIDE 0.9 % IV BOLUS (SEPSIS)
1000.0000 mL | Freq: Once | INTRAVENOUS | Status: AC
  Administered 2017-11-21: 1000 mL via INTRAVENOUS

## 2017-11-21 MED ORDER — SODIUM CHLORIDE 0.9 % IV BOLUS (SEPSIS): 500.0000 mL | Freq: Once | INTRAVENOUS | Status: DC

## 2017-11-21 MED ORDER — MORPHINE SULFATE (PF) 4 MG/ML IV SOLN
1.0000 mg | INTRAVENOUS | Status: DC | PRN
  Administered 2017-11-21: 1 mg via INTRAVENOUS
  Filled 2017-11-21: qty 1

## 2017-11-21 MED ORDER — PIPERACILLIN-TAZOBACTAM 3.375 G IVPB 30 MIN
3.3750 g | Freq: Once | INTRAVENOUS | Status: AC
  Administered 2017-11-21: 3.375 g via INTRAVENOUS
  Filled 2017-11-21: qty 50

## 2017-11-21 MED ORDER — HALOPERIDOL LACTATE 5 MG/ML IJ SOLN: 0.5000 mg | INTRAMUSCULAR | Status: DC | PRN

## 2017-11-21 MED ORDER — VANCOMYCIN HCL IN DEXTROSE 1-5 GM/200ML-% IV SOLN: 1000.0000 mg | INTRAVENOUS | Status: DC

## 2017-11-21 MED ORDER — ONDANSETRON HCL 4 MG/2ML IJ SOLN
4.0000 mg | Freq: Four times a day (QID) | INTRAMUSCULAR | Status: DC | PRN
  Administered 2017-11-21: 4 mg via INTRAVENOUS
  Filled 2017-11-21: qty 2

## 2017-11-21 MED ORDER — ONDANSETRON 4 MG PO TBDP: 4.0000 mg | ORAL_TABLET | Freq: Four times a day (QID) | ORAL | Status: DC | PRN

## 2017-11-21 MED ORDER — HALOPERIDOL 1 MG PO TABS: 0.5000 mg | ORAL_TABLET | ORAL | Status: DC | PRN

## 2017-11-21 MED ORDER — POLYVINYL ALCOHOL 1.4 % OP SOLN
1.0000 [drp] | Freq: Four times a day (QID) | OPHTHALMIC | Status: DC | PRN
  Filled 2017-11-21: qty 15

## 2017-11-26 LAB — CULTURE, BLOOD (ROUTINE X 2)
CULTURE: NO GROWTH
Culture: NO GROWTH
Special Requests: ADEQUATE
Special Requests: ADEQUATE

## 2017-11-27 NOTE — Progress Notes (Signed)
Took patient off of BiPAP per MD's order.

## 2017-11-27 NOTE — Progress Notes (Signed)
Foley and 2 NSLs D/C'd per protocol.  Family went home.

## 2017-11-27 NOTE — Progress Notes (Signed)
Pharmacy Antibiotic Note  Justin Vaughn is a 84 y.o. male admitted on 08/29/2018 with respiratory distress.  Pharmacy has been consulted for vancomycin and Zosyn dosing for sepsis and infiltrates on CXR.  Patient has AKI.  Afebrile, WBC 20.2, LA 3.64.  Plan: Vanc 1500mg  IV x 1, then 1gm IV Q24H Zosyn EID 3.375 IV Q8H Monitor renal fxn, clinical progress, vanc trough as indicated  Height: 6' (182.9 cm) Weight: 168 lb (76.2 kg) IBW/kg (Calculated) : 77.6  No data recorded.  Recent Labs  Lab 07/29/2018 1055 07/29/2018 1113  WBC 20.2*  --   CREATININE 1.74*  --   LATICACIDVEN  --  3.64*    Estimated Creatinine Clearance: 29.8 mL/min (A) (by C-G formula based on SCr of 1.74 mg/dL (H)).    Allergies  Allergen Reactions  . Ace Inhibitors   . Propranolol   . Prozac [Fluoxetine Hcl]      Vanc 2/23 >> Zosyn 2/23 >>  2/23 BCx - 2/23 UCx -    Sharnae Winfree D. Laney Potashang, PharmD, BCPS Pager:  [phone] 08/29/2018, 12:41 PM

## 2017-11-27 NOTE — Progress Notes (Signed)
Palliative Medicine consult noted. Due to high referral volume, there may be a delay seeing this patient. Please call the Palliative Medicine Team office at [phone] if recommendations are needed in the interim.  Thank you for inviting us to see this patient.  Margret ChanceMelanie G. Coni Homesley, RN, BSN, [facility] Palliative Medicine Team 02-Jan-2018 5:04 PM Office [phone]

## 2017-11-27 NOTE — Progress Notes (Signed)
Placed patient on BiPAP 15/5 100%.

## 2017-11-27 NOTE — ED Notes (Signed)
MD Steinl at the bedside  

## 2017-11-27 NOTE — ED Notes (Signed)
Pt off Bipap audible gurgling coughing and obvious grimacing and discomfort.  Family at bedside.  Daughter prefers to keep monitor on.

## 2017-11-27 NOTE — Progress Notes (Signed)
Pt passed away at 1700 with myself and Bernidita, wife and daughter at his side.  Pt was comfortable. Family wants to use [facility] for services.

## 2017-11-27 NOTE — ED Triage Notes (Signed)
Per [facility], Pt is coming [facility] where he was found in respiratory distress after breakfast this morning by staff. Pt was seen at facility this morning with some distress during breakfast, but then was not seen until later with increased trouble breathing. Pt is responsive to pain. Hx of DM, Hyperlipidemia, and Parkinson. Vitals per EMS: 92/56, 105 HR,   Pt was given 5 mg of Albuterol, 1 Nitro   Lung sounds diminshed with rales. 80% on CPap.

## 2017-11-27 NOTE — H&P (Signed)
History and Physical    MARQUISE WICKE ZOX:[id number] DOB: 17-Oct-1923 DOA: 2017/11/28   PCP: Jarome Matin, MD   Patient coming from:  Home    Chief Complaint: Shortness of breath  HPI: THANIEL COLUCCIO is a 84 y.o. male with medical history significant for parkinsonism, hyperlipidemia, hypertension, diabetes, depression, chronic insomnia, anxiety, brought from his skilled nursing facility after witnessing respiratory distress in the patient, this morning, with decreased responsiveness.  Per chart note, the patient symptoms began after breakfast.  No other information is available at this time.  On transport, the patient was placed on CPAP, and on arrival he was noted to have hypotension as well.  He continues to be tachycardic, tachypneic, and hypotensive.    ED Course:  BP (!) 77/49   Pulse 99   Resp (!) 32   Ht 6' (1.829 m)   Wt 76.2 kg (168 lb)   SpO2 100%   BMI 22.78 kg/m   On presentation, his white count was 20, chloride 112, creatinine 1.74 (normal between 1 and 1.1), glucose 440, with a gap of 16 Albumin 2.7 Total bilirubin 0.7. Troponin 0 0.01. Lactic acid is 3.64 Wife is at bedside.  Extensive discussion with her, regarding the patient's prognosis. EKG Sinus tachycardia, Atrial premature complex, RBBB and LAFB Urine not drawn as the patient had no output at this time   Review of Systems: Unable to obtain, due to level 5 caveat due to respiratory distress-AMS  Past Medical History:  Diagnosis Date  . Anxiety   . Chronic insomnia   . Depression   . Diabetes (HCC)   . High blood pressure   . Hyperlipidemia   . Parkinsonism [facility])     Past Surgical History:  Procedure Laterality Date  . CATARACT EXTRACTION Bilateral 2011  . CYST EXCISION  2011   chest wall    Social History Social History   Socioeconomic History  . Marital status: Married    Spouse name: Not on file  . Number of children: Not on file  . Years of education: Not on file  . Highest  education level: Not on file  Social Needs  . Financial resource strain: Not on file  . Food insecurity - worry: Not on file  . Food insecurity - inability: Not on file  . Transportation needs - medical: Not on file  . Transportation needs - non-medical: Not on file  Occupational History  . Not on file  Tobacco Use  . Smoking status: Former Smoker    Last attempt to quit: 09/29/1973    Years since quitting: 44.1  . Smokeless tobacco: Never Used  Substance and Sexual Activity  . Alcohol use: No    Comment: former drinker  . Drug use: No  . Sexual activity: Not on file  Other Topics Concern  . Not on file  Social History Narrative   Lives at home with wife.  Has 3 children.  Lost one 2 yrs ago.  Retired from [employer].       Allergies  Allergen Reactions  . Ace Inhibitors   . Propranolol   . Prozac [Fluoxetine Hcl]     No family history on file.    Prior to Admission medications   Medication Sig Start Date End Date Taking? Authorizing Provider  ALPRAZolam (XANAX) 0.25 MG tablet Take 0.25 mg by mouth 2 (two) times daily as needed for anxiety.    [provider]  aspirin 81 MG tablet Take 81  mg by mouth daily.    [provider]  carbidopa-levodopa (SINEMET IR) 25-100 MG tablet Take 1 tablet by mouth 3 (three) times daily before meals. Patient taking differently: Take 0.5 tablets by mouth 3 (three) times daily before meals. 14 Days 06/15/17   Penumalli, Glenford Bayley, MD  diphenhydrAMINE (BENADRYL) 25 mg capsule Take 25 mg by mouth at bedtime.    [provider]  donepezil (ARICEPT) 10 MG tablet Take 10 mg by mouth at bedtime.  03/27/17   [provider]  doxylamine, Sleep, (UNISOM) 25 MG tablet Take 25 mg by mouth at bedtime as needed for sleep.     [provider]  Fiber CAPS Take 3 capsules by mouth daily.     [provider]  glimepiride (AMARYL) 2 MG tablet Take 2 mg by mouth daily with breakfast.    [provider]  lactulose (CHRONULAC) 10 GM/15ML solution Take by mouth 3 (three) times daily as needed for mild constipation.    [provider]  lisinopril (PRINIVIL,ZESTRIL) 10 MG tablet Take 10 mg by mouth daily.    [provider]  losartan (COZAAR) 50 MG tablet Take 50 mg by mouth daily.  06/13/17   [provider]  lovastatin (MEVACOR) 20 MG tablet Take 20 mg by mouth at bedtime.    [provider]  metFORMIN (GLUCOPHAGE-XR) 500 MG 24 hr tablet Take 500 mg by mouth 2 (two) times daily.     [provider]  Multiple Vitamins-Minerals (MULTIVITAMIN PO) Take 1 tablet by mouth daily.     [provider]  polyethylene glycol (MIRALAX / GLYCOLAX) packet Take 17 g by mouth daily as needed for moderate constipation.     [provider]  selegiline (ELDEPRYL) 5 MG capsule Take 5 mg by mouth 2 (two) times daily before a meal.    [provider]  sertraline (ZOLOFT) 25 MG tablet Take 1 tablet (25 mg total) by mouth daily. 06/15/17   Penumalli, Glenford Bayley, MD  tuberculin (APLISOL) 5 UNIT/0.1ML injection Inject 5 Units into the skin once.    [provider]    Physical Exam:  Vitals:   2017/12/01 1300 December 01, 2017 1304 12-01-17 1309 Dec 01, 2017 1315  BP: (!) 103/39   (!) 77/49  Pulse:  (!) 102 99 99  Resp: (!) 35 (!) 27 (!) 28 (!) 32  SpO2:  99% 100% 100%  Weight:      Height:       Constitutional: The patient is seen significant degree of distress, despite the use of BiPAP.  He is very ill appearing eyes: PERRL, lids and conjunctivae normal ENMT: Mucous membranes are moist, without exudate or lesions  Neck: normal, supple, no masses, no thyromegaly Respiratory: The patient has moderate to severe respiratory effort,.  Rales are heard bilaterally, with some rhonchi, no wheezing.  Cardiovascular:Tachy  rate and rhythm, no   murmur, rubs or gallops. No extremity edema. 2+ pedal pulses. No carotid bruits.  Abdomen: Soft, no  apparent tenderness no hepatosplenomegaly. Bowel sounds decreased  musculoskeletal: no clubbing / cyanosis. Moves all extremities Skin:  Very pale and clammy Some areas of excoriations in his legs, open lesions.  No jaundice. Neurologic: Sensation intact he is unable to be assessed, as the patient is unable to follow commands at this time.  He is unresponsive.      Labs on Admission: I have personally reviewed following labs and imaging studies  CBC: Recent Labs  Lab December 01, 2017 1055  WBC  20.2*  NEUTROABS 17.8*  HGB 14.3  HCT 44.8  MCV 92.8  PLT 303    Basic Metabolic Panel: Recent Labs  Lab Nov 22, 2017 1055  NA 152*  K 3.5  CL 112*  CO2 24  GLUCOSE 440*  BUN 35*  CREATININE 1.74*  CALCIUM 9.5    GFR: Estimated Creatinine Clearance: 29.8 mL/min (A) (by C-G formula based on SCr of 1.74 mg/dL (H)).  Liver Function Tests: Recent Labs  Lab Nov 22, 2017 1055  AST 18  ALT <5*  ALKPHOS 140*  BILITOT 0.7  PROT 6.7  ALBUMIN 2.7*   No results for input(s): LIPASE, AMYLASE in the last 168 hours. No results for input(s): AMMONIA in the last 168 hours.  Coagulation Profile: No results for input(s): INR, PROTIME in the last 168 hours.  Cardiac Enzymes: No results for input(s): CKTOTAL, CKMB, CKMBINDEX, TROPONINI in the last 168 hours.  BNP (last 3 results) No results for input(s): PROBNP in the last 8760 hours.  HbA1C: No results for input(s): HGBA1C in the last 72 hours.  CBG: No results for input(s): GLUCAP in the last 168 hours.  Lipid Profile: No results for input(s): CHOL, HDL, LDLCALC, TRIG, CHOLHDL, LDLDIRECT in the last 72 hours.  Thyroid Function Tests: No results for input(s): TSH, T4TOTAL, FREET4, T3FREE, THYROIDAB in the last 72 hours.  Anemia Panel: No results for input(s): VITAMINB12, FOLATE, FERRITIN, TIBC, IRON, RETICCTPCT in the last 72 hours.  Urine analysis:    Component Value Date/Time   COLORURINE YELLOW 07/01/2017 1707   APPEARANCEUR HAZY  (A) 07/01/2017 1707   LABSPEC 1.036 (H) 07/01/2017 1707   PHURINE 5.0 07/01/2017 1707   GLUCOSEU >=500 (A) 07/01/2017 1707   HGBUR NEGATIVE 07/01/2017 1707   BILIRUBINUR NEGATIVE 07/01/2017 1707   KETONESUR NEGATIVE 07/01/2017 1707   PROTEINUR 100 (A) 07/01/2017 1707   NITRITE NEGATIVE 07/01/2017 1707   LEUKOCYTESUR NEGATIVE 07/01/2017 1707    Sepsis Labs: @LABRCNTIP (procalcitonin:4,lacticidven:4) )No results found for this or any previous visit (from the past 240 hour(s)).   Radiological Exams on Admission: Dg Chest Port 1 View  Result Date: 04-13-2018 CLINICAL DATA:  Respiratory distress EXAM: PORTABLE CHEST 1 VIEW COMPARISON:  July 01, 2017 FINDINGS: There is new infiltrate in the right lower lung. There may also be mild infiltrate in the left base. Healed rib fractures on the left. No pneumothorax. Stable cardiomediastinal silhouette. IMPRESSION: New infiltrate in the right base. There may be mild infiltrate on the left as well. Recommend follow-up to resolution. Electronically Signed   By: Gerome Samavid  Williams III M.D   On: 007-16-2019 11:19    EKG: Independently reviewed.  Assessment/Plan Active Problems:   Sepsis due to undetermined organism with acute respiratory failure (HCC)   Hyperlipidemia   Diabetes (HCC)   High blood pressure   Parkinsonism (HCC)   Depression   Anxiety   Chronic insomnia   Acute respiratory failure with hypoxia (HCC)   CKD (chronic kidney disease)   DNR (do not resuscitate)     Sepsis likely due to urine and  respiratory source,  with accompanied unresponsiveness QSOFA score 3.  patient meets criteria given tachycardia, tachypnea, hypotension leukocytosis, and evidence of organ dysfunction.  Antibiotics delivered in the ED with vancomycin and Zosyn, and he received 2-1/2 L of fluid, without significant improvement.  In addition, he also received 5 mg of albuterol, nitroglycerin, without significant response.  Urinalysis was unable to be drawn, as the  patient had no urine output.  Lactic acid was 3.64 he  is afebrile, his blood pressure 77/49, pulse 99.  CBC remarkable for leukocytosis, WBC 20 .EKG Sinus Tach.  Troponin negative chest x-ray shows new infiltrate in the right base, with mild infiltrate in the left base.  Had a lengthy discussion with the family regarding the Care plan. Admit with expectation that the patient will die in hospital. No further labs, interventions or films. Family agrees with the plan  Admit MedSurg for comfort care End-of-life protocol discontinue home medications Morphine, Ativan and Robinul for secretions Palliative care Consult stat Spiritual care     Type II Diabetes Current blood sugar level is 440 No further meds management fue to EOL   Hyperlipidemia No further meds  Acute on Chronic CKD: likely due to dehydration in the setting of sepsis, current creatinine is 1.74, No further management   Parkinsonism/ Depression Will not continue his home meds   DVT prophylaxis: None due to end of life Code Status:    DNR Family Communication:  Discussed with patient's wife Disposition Plan: Expect patient to be discharged to home after condition improves Consults called:    Palliative care Admission status: MedSurg   Marlowe Kays, PA-C [facility]   Amion text  [phone]   2017/12/04, 1:34 PM

## 2017-11-27 NOTE — Progress Notes (Signed)
Admitted from ED at around [id number] this 84 year-old white male via strecher family at bedside. Non-responsive, Admission care rendered

## 2017-11-27 NOTE — ED Provider Notes (Signed)
[facility] EMERGENCY DEPARTMENT Provider Note   CSN: [id number] Arrival date & time: 11/23/17  1044     History   Chief Complaint Chief Complaint  Patient presents with  . Respiratory Distress    HPI Justin Vaughn is a 84 y.o. male.  Patient with hx Parkinson's, arrives via ECF via EMS. Pt was noted to be in respiratory distress this AM, and with decreased responsiveness. EMS notes BP low.  Patient is not responsive verbally on arrival and in respiratory distress - level 5 caveat. DNR on chart.    The history is provided by the patient and the EMS personnel. The history is limited by the condition of the patient.    Past Medical History:  Diagnosis Date  . Anxiety   . Chronic insomnia   . Depression   . Diabetes (HCC)   . High blood pressure   . Hyperlipidemia   . Parkinsonism [facility])     Patient Active Problem List   Diagnosis Date Noted  . Other and unspecified hyperlipidemia   . Diabetes (HCC)   . High blood pressure     Past Surgical History:  Procedure Laterality Date  . CATARACT EXTRACTION Bilateral 2011  . CYST EXCISION  2011   chest wall       Home Medications    Prior to Admission medications   Medication Sig Start Date End Date Taking? Authorizing Provider  ALPRAZolam (XANAX) 0.25 MG tablet Take 0.25 mg by mouth 2 (two) times daily as needed for anxiety.    [provider]  aspirin 81 MG tablet Take 81 mg by mouth daily.    [provider]  carbidopa-levodopa (SINEMET IR) 25-100 MG tablet Take 1 tablet by mouth 3 (three) times daily before meals. Patient taking differently: Take 0.5 tablets by mouth 3 (three) times daily before meals. 14 Days 06/15/17   Penumalli, Glenford Bayley, MD  diphenhydrAMINE (BENADRYL) 25 mg capsule Take 25 mg by mouth at bedtime.    [provider]  donepezil (ARICEPT) 10 MG tablet Take 10 mg by mouth at bedtime.  03/27/17   [provider]  doxylamine, Sleep, (UNISOM) 25  MG tablet Take 25 mg by mouth at bedtime as needed for sleep.     [provider]  Fiber CAPS Take 3 capsules by mouth daily.     [provider]  glimepiride (AMARYL) 2 MG tablet Take 2 mg by mouth daily with breakfast.    [provider]  lactulose (CHRONULAC) 10 GM/15ML solution Take by mouth 3 (three) times daily as needed for mild constipation.    [provider]  lisinopril (PRINIVIL,ZESTRIL) 10 MG tablet Take 10 mg by mouth daily.    [provider]  losartan (COZAAR) 50 MG tablet Take 50 mg by mouth daily.  06/13/17   [provider]  lovastatin (MEVACOR) 20 MG tablet Take 20 mg by mouth at bedtime.    [provider]  metFORMIN (GLUCOPHAGE-XR) 500 MG 24 hr tablet Take 500 mg by mouth 2 (two) times daily.     [provider]  Multiple Vitamins-Minerals (MULTIVITAMIN PO) Take 1 tablet by mouth daily.     [provider]  polyethylene glycol (MIRALAX / GLYCOLAX) packet Take 17 g by mouth daily as needed for moderate constipation.     [provider]  selegiline (ELDEPRYL) 5 MG capsule Take 5 mg by mouth 2 (two) times daily before a meal.    [provider]  sertraline (ZOLOFT) 25 MG tablet Take 1 tablet (25 mg total) by mouth daily. 06/15/17   Penumalli, Glenford BayleyVikram R, MD  tuberculin (APLISOL) 5 UNIT/0.1ML injection Inject 5 Units into the skin once.    [provider]    Family History No family history on file.  Social History Social History   Tobacco Use  . Smoking status: Former Smoker    Last attempt to quit: 09/29/1973    Years since quitting: 44.1  . Smokeless tobacco: Never Used  Substance Use Topics  . Alcohol use: No    Comment: former drinker  . Drug use: No     Allergies   Ace inhibitors; Propranolol; and Prozac [fluoxetine hcl]   Review of Systems Review of Systems  Unable to perform ROS: Patient unresponsive  level 5 caveat - unresponsive and respiratory  distress     Physical Exam Updated Vital Signs BP (!) 66/49 (BP Location: Right Arm)   Pulse 96   Resp (!) 26   Ht 1.829 m (6')   Wt 76.2 kg (168 lb)   SpO2 (!) 75%   BMI 22.78 kg/m   Physical Exam  Constitutional: He appears well-developed and well-nourished. He appears distressed.  On bipap. In respiratory distress. Eyes closed, not verbally responsive.   HENT:  Head: Atraumatic.  Mouth/Throat: Oropharynx is clear and moist.  Eyes: Conjunctivae are normal. Pupils are equal, round, and reactive to light.  Neck: Neck supple. No tracheal deviation present.  Cardiovascular: Normal rate, regular rhythm, normal heart sounds and intact distal pulses.  Pulmonary/Chest: No accessory muscle usage. He is in respiratory distress. He has rales.  Abdominal: Soft. Bowel sounds are normal. He exhibits no distension. There is no tenderness.  Genitourinary:  Genitourinary Comments: No cva tenderness  Musculoskeletal: He exhibits no edema.  Neurological:  Opens eyes to command. Moves bil ext minimally.   Skin: Skin is warm and dry.  Psychiatric:  Unresponsive.   Nursing note and vitals reviewed.    ED Treatments / Results  Labs (all labs ordered are listed, but only abnormal results are displayed) Results for orders placed or performed during the hospital encounter of Apr 25, 2018  Comprehensive metabolic panel  Result Value Ref Range   Sodium 152 (H) 135 - 145 mmol/L   Potassium 3.5 3.5 - 5.1 mmol/L   Chloride 112 (H) 101 - 111 mmol/L   CO2 24 22 - 32 mmol/L   Glucose, Bld 440 (H) 65 - 99 mg/dL   BUN 35 (H) 6 - 20 mg/dL   Creatinine, Ser 6.961.74 (H) 0.61 - 1.24 mg/dL   Calcium 9.5 8.9 - 29.510.3 mg/dL   Total Protein 6.7 6.5 - 8.1 g/dL   Albumin 2.7 (L) 3.5 - 5.0 g/dL   AST 18 15 - 41 U/L   ALT <5 (L) 17 - 63 U/L   Alkaline Phosphatase 140 (H) 38 - 126 U/L   Total Bilirubin 0.7 0.3 - 1.2 mg/dL   GFR calc non Af Amer 33 (L) >60 mL/min   GFR calc Af Amer 38 (L) >60 mL/min   Anion gap  16 (H) 5 - 15  CBC WITH DIFFERENTIAL  Result Value Ref Range   WBC 20.2 (H) 4.0 - 10.5 K/uL   RBC 4.83 4.22 - 5.81 MIL/uL   Hemoglobin 14.3 13.0 - 17.0 g/dL   HCT 28.444.8 13.239.0 - 44.052.0 %   MCV 92.8 78.0 - 100.0 fL   MCH 29.6 26.0 - 34.0 pg   MCHC 31.9 30.0 - 36.0  g/dL   RDW 16.1 09.6 - 04.5 %   Platelets 303 150 - 400 K/uL   Neutrophils Relative % 88 %   Lymphocytes Relative 6 %   Monocytes Relative 6 %   Eosinophils Relative 0 %   Basophils Relative 0 %   Neutro Abs 17.8 (H) 1.7 - 7.7 K/uL   Lymphs Abs 1.2 0.7 - 4.0 K/uL   Monocytes Absolute 1.2 (H) 0.1 - 1.0 K/uL   Eosinophils Absolute 0.0 0.0 - 0.7 K/uL   Basophils Absolute 0.0 0.0 - 0.1 K/uL   Smear Review MORPHOLOGY UNREMARKABLE   I-Stat CG4 Lactic Acid, ED  (not at  [facility])  Result Value Ref Range   Lactic Acid, Venous 3.64 (HH) 0.5 - 1.9 mmol/L   Comment NOTIFIED PHYSICIAN   I-stat troponin, ED (not at [facility])  Result Value Ref Range   Troponin i, poc 0.01 0.00 - 0.08 ng/mL   Comment 3            EKG  EKG Interpretation  Date/Time:  Saturday 2017-11-28 10:44:27 EST Ventricular Rate:  98 PR Interval:    QRS Duration: 130 QT Interval:  384 QTC Calculation: 491 R Axis:   -93 Text Interpretation:  Sinus tachycardia Atrial premature complex RBBB and LAFB Confirmed by Cathren Laine ([id number]) on November 28, 2017 12:16:54 PM       Radiology Dg Chest Port 1 View  Result Date: 2017-11-28 CLINICAL DATA:  Respiratory distress EXAM: PORTABLE CHEST 1 VIEW COMPARISON:  July 01, 2017 FINDINGS: There is new infiltrate in the right lower lung. There may also be mild infiltrate in the left base. Healed rib fractures on the left. No pneumothorax. Stable cardiomediastinal silhouette. IMPRESSION: New infiltrate in the right base. There may be mild infiltrate on the left as well. Recommend follow-up to resolution. Electronically Signed   By: Gerome Sam III M.D   On: 2017-11-28 11:19    Procedures Procedures (including critical  care time)  Medications Ordered in ED Medications - No data to display   Initial Impression / Assessment and Plan / ED Course  I have reviewed the triage vital signs and the nursing notes.  Pertinent labs & imaging results that were available during my care of the patient were reviewed by me and considered in my medical decision making (see chart for details).  Iv ns. Continuous pulse ox and monitor.   bipap - resp therapy consulted - bipap settings adjusted.   Reviewed nursing notes and prior charts for additional history.   xrays reviewed - pna on cxr.  Labs  Reviewed - aki, hypernatremia, leucocytosis.   Lactate is v high, bp low. Cultures sent. 30 cc/kg ns iv bolus. vanc and zosyn iv.  Family contacted.  bipap adjustment.  Multiple reassessments of patient completed. Persistent resp distress and unresponsiveness.   hospitalists consulted for admission.  CRITICAL CARE  RE acute respiratory failure, elevated lactate, hypotension, aki, hypernatremia.  Performed by: Suzi Roots Total critical care time: 140 minutes Critical care time was exclusive of separately billable procedures and treating other patients. Critical care was necessary to treat or prevent imminent or life-threatening deterioration. Critical care was time spent personally by me on the following activities: development of treatment plan with patient and/or surrogate as well as nursing, discussions with consultants, evaluation of patient's response to treatment, examination of patient, obtaining history from patient or surrogate, ordering and performing treatments and interventions, ordering and review of laboratory studies, ordering and review of radiographic studies,  pulse oximetry and re-evaluation of patient's condition.    Final Clinical Impressions(s) / ED Diagnoses   Final diagnoses:  None    ED Discharge Orders    None       Cathren Laine, MD 12-06-2017 1324

## 2017-11-27 NOTE — ED Notes (Signed)
Report attempted 

## 2017-11-27 NOTE — ED Notes (Signed)
Family at bedside. 

## 2017-11-27 NOTE — Progress Notes (Signed)
Patient not doing weel and dr. Ivan Anchorsequested chaplain. Visited with wife and daughter and patient. Concerned about getting in touch [facility].  Looked up info and left messages for him and for Associate pastor who is at a retreat.  Shared with family that I reached out and left messages.  Had prayer and offered support for the family briefly.  Would have stayed longer but too many events for chaplain to offer more. Phebe CollaDonna S Jorell Agne, Chaplain   04/15/18 1400  Clinical Encounter Type  Visited With Patient and family together  Visit Type Spiritual support;Critical Care  Referral From Physician  Consult/Referral To Chaplain  Spiritual Encounters  Spiritual Needs Prayer;Emotional;Other (Comment) (patient expected to pass quickly)

## 2017-11-27 DEATH — deceased

## 2017-12-28 NOTE — Death Summary Note (Signed)
  DEATH SUMMARY   Patient Details  Name: Justin Vaughn MRN: [id number] DOB: 1924/02/12  Admission/Discharge Information   Admit Date:  Apr 30, 2018  Date of Death: Date of Death: 08/05/18(Simultaneous filing. User may not have seen previous data.)  Time of Death: Time of Death: 1700(Simultaneous filing. User may not have seen previous data.)  Length of Stay: 1  Referring Physician: Jarome MatinPaterson, Daniel, MD   Reason(s) for Hospitalization  sepsis  Diagnoses  Preliminary cause of death:  Secondary Diagnoses (including complications and co-morbidities):  Principal Problem:   Sepsis due to undetermined organism with acute respiratory failure (HCC) Active Problems:   Hyperlipidemia   Diabetes (HCC)   High blood pressure   Parkinsonism (HCC)   Depression   Anxiety   Chronic insomnia   Acute respiratory failure with hypoxia (HCC)   CKD (chronic kidney disease)   DNR (do not resuscitate)   End of life care   Brief Hospital Course (including significant findings, care, treatment, and services provided and events leading to death)  Justin Vaughn is a 84 y.o. year old male who did in extremis from an outside nursing facility.  Patient did not respond to treatment in the emergency department his blood pressures and his sats remained very low.  He had an active MOST form and we followed his wishes.  The patient expired at 5 PM.  His wife was present as was his daughter.    Pertinent Labs and Studies  Significant Diagnostic Studies Dg Chest Port 1 View  Result Date: Apr 30, 2018 CLINICAL DATA:  Respiratory distress EXAM: PORTABLE CHEST 1 VIEW COMPARISON:  July 01, 2017 FINDINGS: There is new infiltrate in the right lower lung. There may also be mild infiltrate in the left base. Healed rib fractures on the left. No pneumothorax. Stable cardiomediastinal silhouette. IMPRESSION: New infiltrate in the right base. There may be mild infiltrate on the left as well. Recommend follow-up to  resolution. Electronically Signed   By: Gerome Samavid  Williams III M.D   On: 0Aug 02, 2019 11:19    Microbiology No results found for this or any previous visit (from the past 240 hour(s)).  Lab Basic Metabolic Panel: No results for input(s): NA, K, CL, CO2, GLUCOSE, BUN, CREATININE, CALCIUM, MG, PHOS in the last 168 hours. Liver Function Tests: No results for input(s): AST, ALT, ALKPHOS, BILITOT, PROT, ALBUMIN in the last 168 hours. No results for input(s): LIPASE, AMYLASE in the last 168 hours. No results for input(s): AMMONIA in the last 168 hours. CBC: No results for input(s): WBC, NEUTROABS, HGB, HCT, MCV, PLT in the last 168 hours. Cardiac Enzymes: No results for input(s): CKTOTAL, CKMB, CKMBINDEX, TROPONINI in the last 168 hours. Sepsis Labs: No results for input(s): PROCALCITON, WBC, LATICACIDVEN in the last 168 hours.  Procedures/Operations  none   Lahoma Crockerheresa C Sheehan 12/03/2017, 1:07 PM

## 2018-07-15 IMAGING — DX DG CHEST 1V PORT
1 series · 1 of 1 positions shown · non-contrast
Comparison: None.

CLINICAL DATA: Cough for several weeks

EXAM:
PORTABLE CHEST 1 VIEW

[chest ap]
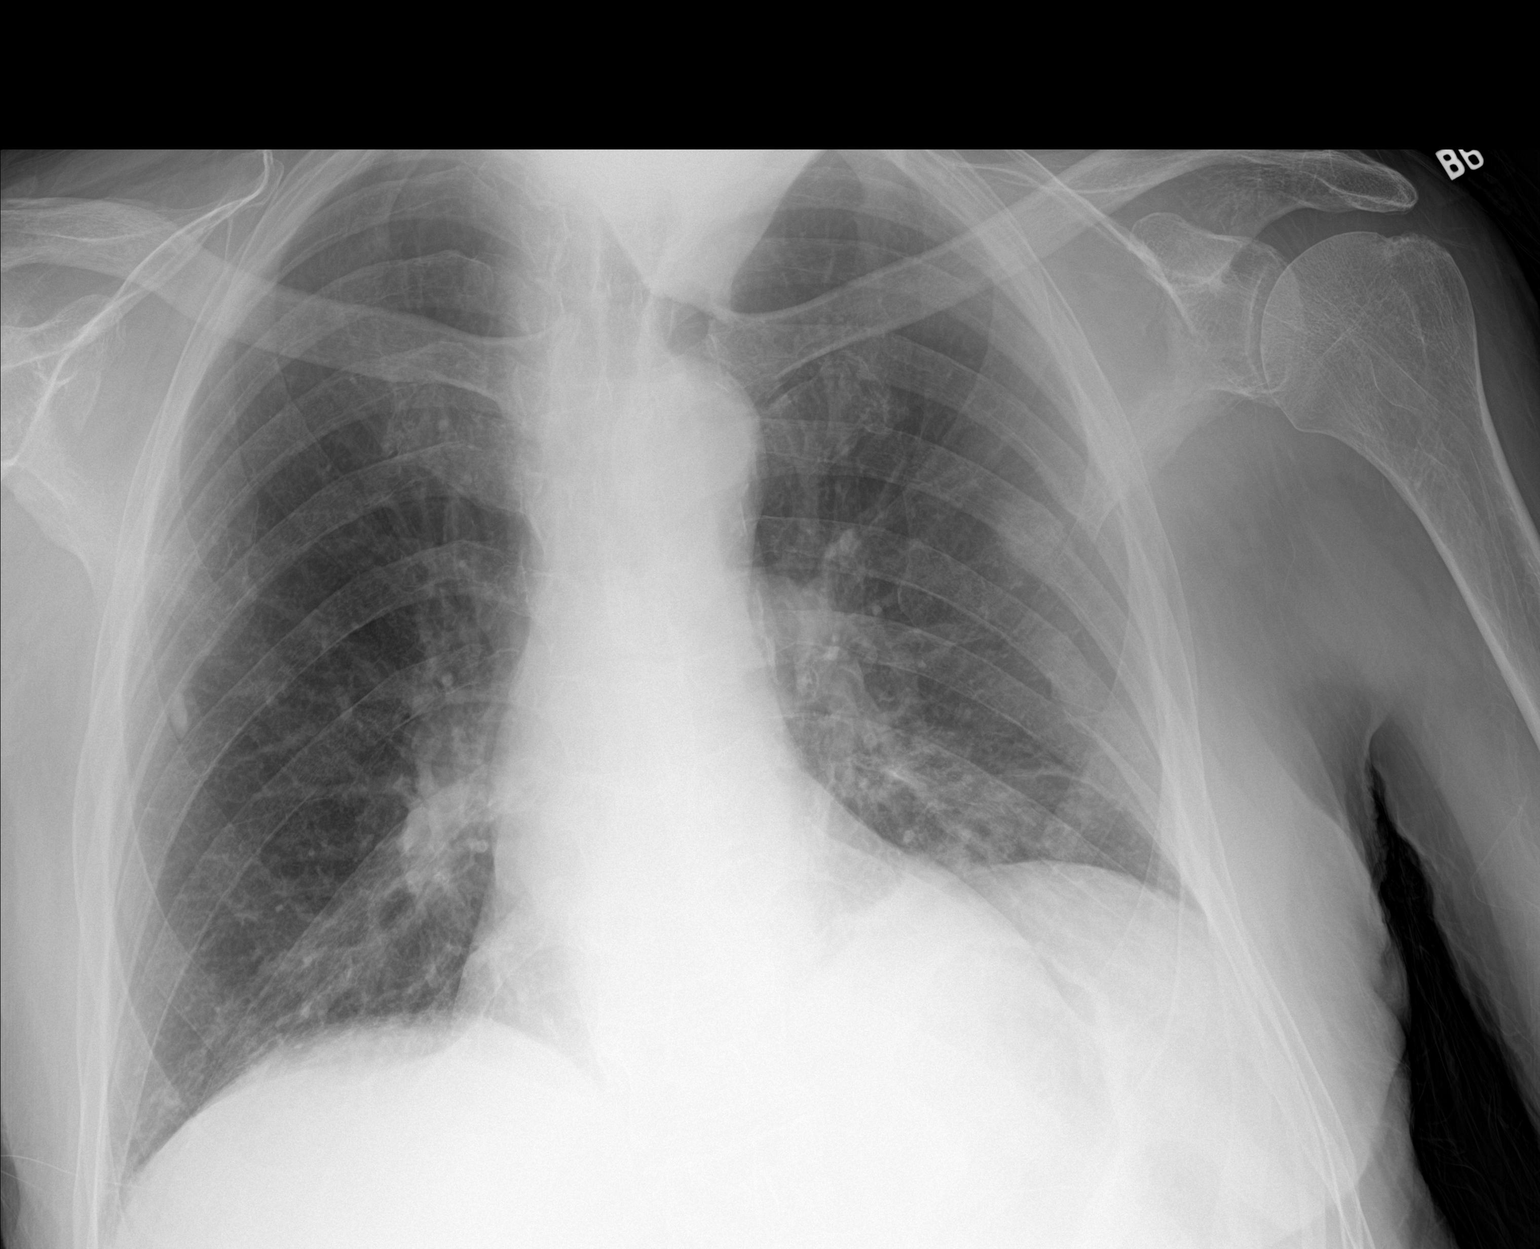

[1 of 1 positions shown; findings below may reference images not displayed]

FINDINGS: There is an oval subcentimeter nodular density in the lateral right
mid lung zone. Healing posterolateral left rib fractures are noted.
Low volumes with bibasilar atelectasis versus airspace disease.
There is elevation of the left hemidiaphragm. Heart is mildly
prominent. Normal vascularity. No pneumothorax.
IMPRESSION: Bibasilar atelectasis versus airspace disease. Followup PA and
lateral chest X-ray is recommended in 3-4 weeks following trial of
antibiotic therapy to ensure resolution and exclude underlying
malignancy.

Nodular density in the right mid lung zone. Attention on follow-up
dome

Healing left rib fractures.
